# Patient Record
Sex: Male | Born: 1975
Health system: Southern US, Community
[De-identification: ages and names within clinical notes are randomized; demographics above are authoritative.]

## PROBLEM LIST (undated history)

## (undated) DIAGNOSIS — I4891 Unspecified atrial fibrillation: Secondary | ICD-10-CM

## (undated) HISTORY — DX: Unspecified atrial fibrillation: I48.91

## (undated) HISTORY — PX: CHOLECYSTECTOMY: SHX55

---

## 2005-12-07 ENCOUNTER — Ambulatory Visit (HOSPITAL_COMMUNITY): Admission: RE | Admit: 2005-12-07 | Discharge: 2005-12-07 | Payer: Self-pay | Admitting: Gastroenterology

## 2006-03-14 ENCOUNTER — Encounter (INDEPENDENT_AMBULATORY_CARE_PROVIDER_SITE_OTHER): Payer: Self-pay | Admitting: *Deleted

## 2006-03-14 ENCOUNTER — Ambulatory Visit (HOSPITAL_COMMUNITY): Admission: RE | Admit: 2006-03-14 | Discharge: 2006-03-14 | Payer: Self-pay | Admitting: General Surgery

## 2010-05-13 ENCOUNTER — Ambulatory Visit: Payer: Self-pay | Admitting: Cardiology

## 2010-05-13 ENCOUNTER — Encounter: Payer: Self-pay | Admitting: Cardiology

## 2010-05-13 ENCOUNTER — Observation Stay (HOSPITAL_COMMUNITY): Admission: EM | Admit: 2010-05-13 | Discharge: 2010-05-13 | Payer: Self-pay | Admitting: Emergency Medicine

## 2010-05-23 DIAGNOSIS — I4891 Unspecified atrial fibrillation: Secondary | ICD-10-CM | POA: Insufficient documentation

## 2010-05-25 ENCOUNTER — Ambulatory Visit: Payer: Self-pay | Admitting: Cardiology

## 2010-05-25 DIAGNOSIS — F17200 Nicotine dependence, unspecified, uncomplicated: Secondary | ICD-10-CM | POA: Insufficient documentation

## 2010-05-26 LAB — CONVERTED CEMR LAB
BUN: 10 mg/dL (ref 6–23)
Calcium: 9.1 mg/dL (ref 8.4–10.5)
Chloride: 107 meq/L (ref 96–112)
GFR calc non Af Amer: 95.46 mL/min (ref >60)
Glucose, Bld: 108 mg/dL — ABNORMAL HIGH (ref 70–99)

## 2010-07-29 ENCOUNTER — Encounter (INDEPENDENT_AMBULATORY_CARE_PROVIDER_SITE_OTHER): Payer: Self-pay | Admitting: *Deleted

## 2010-10-11 ENCOUNTER — Encounter (INDEPENDENT_AMBULATORY_CARE_PROVIDER_SITE_OTHER): Payer: Self-pay | Admitting: *Deleted

## 2010-12-13 NOTE — Letter (Signed)
Summary: Appointment - Missed  [facility], Main Office  [address]   [place], [place] [address]   Phone: [phone]  Fax: [phone]     July 29, 2010 MRN: [id number]   Gary Sandoval [address] [place], [place]  [address]   Dear Mr. Damron, Our records indicate you missed your appointment on  07/04/10 with Dr. Myrtis Ser .It is very important that we reach you to reschedule this appointment. We look forward to participating in your health care needs. Please contact us at the number listed above at your earliest convenience to reschedule this appointment.     Sincerely,  Artist

## 2010-12-13 NOTE — Letter (Signed)
Summary: Appointment - Missed  [facility], Main Office  [address]   [place], [place] [address]   Phone: [phone]  Fax: [phone]     October 11, 2010 MRN: [id number]   Gary Sandoval [address] [place], [place]  [address]   Dear [name],  Our records indicate you missed your appointment on 07/15/10 with Dr. Myrtis Ser . It is very important that we reach you to reschedule this appointment. We look forward to participating in your health care needs. Please contact us at the number listed above at your earliest convenience to reschedule this appointment.     Sincerely,  Judie Grieve  [employer] Scheduling Team

## 2010-12-13 NOTE — Assessment & Plan Note (Signed)
Summary: eph/afib/jml   Visit Type:  post hospital visit Primary Provider:  none  CC:  atrial fibrillation.  History of Present Illness: The patient was seen and admitted to [facility] on May 13, 2010.  He presented with rapid atrial fibrillation with a rate of 160s.  He felt very poorly with this.  He was admitted and two-dimensional echo was done showing good LV function.  He converted later that day and we allowed him to go home on diltiazem.  TSH was normal.  It was clear that he had a very significant caffeine intake.  He also smoked 2-3 packs per day.  Since being at home he has done well.  His potassium was noted to be 3.3 in the hospital.  He received some potassium in the hospital has taken a small supplement at home.  He feels well now.  Current Medications (verified): 1)  Diltiazem Hcl Cr 90 Mg Xr12h-Cap (Diltiazem Hcl) .... Take One Capsule By Mouth Twice A Day 2)  Potassium 99 Mg Tabs (Potassium) .... Two Times A Day  Allergies (verified): No Known Drug Allergies  Past History:  Past Medical History: Atrial Fibrillation....paroxysmal.... May 13, 2010 Hypokalemia Excess caffeine intake Tobacco abuse EF 65%.... echo.... 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  Alcohol Use - no Regular Exercise - no Drug Use - no  Review of Systems       Patient denies fever, chills, headache, sweats, rash, change in vision, change in hearing, chest pain, cough, nausea vomiting, urinary symptoms.  All of the systems are reviewed and are negative.  Vital Signs:  Patient profile:   35 year old male Height:      80 inches Weight:      323 pounds BMI:     35.61 Pulse rate:   78 / minute BP sitting:   126 / 74  (left arm) Cuff size:   large  Vitals Entered By:  Hardin Negus, RMA (May 25, 2010 12:23 PM)  Physical Exam  General:  patient is stable. Eyes:  no xanthelasma. Neck:  no jugular venous distention. Lungs:  lungs are clear.  Respiratory effort is nonlabored. Heart:  cardiac exam reveals S1 and S2.  There no clicks or significant murmurs. Abdomen:  abdomen is soft and Extremities:  no peripheral edema. Psych:  patient is oriented to person time and place.  Affect is normal.   Impression & Recommendations:  Problem # 1:  * CAFFEINE EXCESS since being in the hospital the patient says that he has stopped caffeine completely.  He is tolerating this.  Problem # 2:  * HYPOKALEMIA In the hospital his potassium was reduced.  He is taking a small over-the-counter supplement.  Chemistry will be checked today.  Problem # 3:  TOBACCO ABUSE (ICD-305.1) The patient has cut his smoking to approximately 1/2 pack per day.  He seems quite motivated.  I am hopeful he'll stop completely.  Problem # 4:  ATRIAL FIBRILLATION (ICD-427.31)  Orders: EKG w/ Interpretation (93000) TLB-BMP (Basic Metabolic Panel-BMET) (80048-METABOL) EKG is done today and reviewed by me.  There is normal sinus rhythm.  The patient has now had one episode of atrial fibrillation.  He has normal LV function.  There no significant valvular abnormalities.  I'm hopeful with the dramatic decrease  in caffeine and smoking that he may not have any recurrent problems.  I'll see him for followup and we'll decide about whether he needs to remain on diltiazem.  Patient Instructions: 1)  Lab today 2)  Follow up in 8 weeks Prescriptions: DILTIAZEM HCL 90 MG TABS (DILTIAZEM HCL) Take 1 tablet by mouth two times a day  #60 x 6   Entered by:   Meredith Staggers, RN   Authorized by:   Talitha Givens, MD, [facility]   Signed by:   Meredith Staggers, RN on 05/25/2010   Method used:   Electronically to        [facility] [id number] (retail)       [address]       [place], [place]  [address]        Ph: [phone]       Fax: [phone]   RxID:   [id number]

## 2011-01-29 LAB — HEMOGLOBIN A1C: Hgb A1c MFr Bld: 5.4 % (ref <5.7)

## 2011-01-29 LAB — DIFFERENTIAL
Lymphocytes Relative: 39 % (ref 12–46)
Lymphs Abs: 3.2 10*3/uL (ref 0.7–4.0)
Monocytes Absolute: 0.5 10*3/uL (ref 0.1–1.0)
Monocytes Relative: 7 % (ref 3–12)
Neutrophils Relative %: 51 % (ref 43–77)

## 2011-01-29 LAB — COMPREHENSIVE METABOLIC PANEL
AST: 17 U/L (ref 0–37)
Albumin: 3.5 g/dL (ref 3.5–5.2)
BUN: 8 mg/dL (ref 6–23)
Calcium: 8.8 mg/dL (ref 8.4–10.5)
Chloride: 107 mEq/L (ref 96–112)
Creatinine, Ser: 0.99 mg/dL (ref 0.4–1.5)
GFR calc Af Amer: >60 mL/min (ref >60)
GFR calc non Af Amer: >60 mL/min (ref >60)
Potassium: 4.4 mEq/L (ref 3.5–5.1)
Sodium: 141 mEq/L (ref 135–145)
Total Bilirubin: 0.7 mg/dL (ref 0.3–1.2)

## 2011-01-29 LAB — CBC
HCT: 45.8 % (ref 39.0–52.0)
Hemoglobin: 15.7 g/dL (ref 13.0–17.0)
MCH: 32.7 pg (ref 26.0–34.0)
MCHC: 34.3 g/dL (ref 30.0–36.0)
RDW: 13.3 % (ref 11.5–15.5)
WBC: 8.2 10*3/uL (ref 4.0–10.5)

## 2011-01-29 LAB — BRAIN NATRIURETIC PEPTIDE: Pro B Natriuretic peptide (BNP): 96 pg/mL (ref 0.0–100.0)

## 2011-01-29 LAB — CK TOTAL AND CKMB (NOT AT ARMC)
CK, MB: 1.5 ng/mL (ref 0.3–4.0)
Relative Index: 1.1 (ref 0.0–2.5)

## 2011-01-29 LAB — POCT CARDIAC MARKERS
Myoglobin, poc: 87.9 ng/mL (ref 12–200)
Troponin i, poc: <0.05 ng/mL (ref 0.00–0.09)

## 2011-01-29 LAB — MAGNESIUM: Magnesium: 2.2 mg/dL (ref 1.5–2.5)

## 2011-01-29 LAB — POCT I-STAT, CHEM 8
BUN: 9 mg/dL (ref 6–23)
Creatinine, Ser: 1 mg/dL (ref 0.4–1.5)
Sodium: 142 mEq/L (ref 135–145)
TCO2: 20 mmol/L (ref 0–100)

## 2011-01-29 LAB — APTT: aPTT: 30 seconds (ref 24–37)

## 2011-03-30 ENCOUNTER — Emergency Department (HOSPITAL_COMMUNITY)
Admission: EM | Admit: 2011-03-30 | Discharge: 2011-03-30 | Disposition: A | Discharge status: Home / Self Care | Payer: Self-pay | Attending: Emergency Medicine | Admitting: Emergency Medicine

## 2011-03-30 DIAGNOSIS — J3489 Other specified disorders of nose and nasal sinuses: Secondary | ICD-10-CM | POA: Insufficient documentation

## 2011-03-30 DIAGNOSIS — J329 Chronic sinusitis, unspecified: Secondary | ICD-10-CM | POA: Insufficient documentation

## 2011-03-30 DIAGNOSIS — I4891 Unspecified atrial fibrillation: Secondary | ICD-10-CM | POA: Insufficient documentation

## 2011-03-31 NOTE — Op Note (Signed)
NAME:  Gary Sandoval, Gary Sandoval NO.:  [id number]   MEDICAL RECORD NO.:  [id number]          PATIENT TYPE:  AMB   LOCATION:  DAY                          FACILITY:  [facility]   PHYSICIAN:  Gita Kudo, M.D. DATE OF BIRTH:  08/18/1976   DATE OF PROCEDURE:  03/14/2006  DATE OF DISCHARGE:                                 OPERATIVE REPORT   OPERATIVE PROCEDURE:  Laparoscopic cholecystectomy with intraoperative  cholangiogram.   SURGEON:  Gita Kudo, M.D.   ASSISTANT:  Maisie Fus A. Cornett, M.D.   ANESTHESIA:  General endotracheal.   PREOPERATIVE DIAGNOSIS:  Cholecystitis, cholelithiasis.   PREOPERATIVE DIAGNOSIS:  Cholecystitis, cholelithiasis, with normal  cholangiogram.   CLINICAL SUMMARY:  36 year old man with abdominal pain.  Workup by Dr. Loreta Ave  and her group revealed gallstones.  He is brought in for elective  cholecystectomy.  He had slightly elevated liver function studies one week  ago.  His preop studies today showed a normal white count and totally normal  liver function as well as amylase and lipase.   OPERATIVE FINDINGS:  The gallbladder was thin-walled without evidence of  acute inflammation or infection.  The cystic duct was normal in anatomy as  was the cystic artery and the cholangiogram looked good.   OPERATIVE PROCEDURE:  Under satisfactory general endotracheal anesthesia,  having received 1 grams Ancef preop, the patient's abdomen was prepped and  draped in a standard fashion.  A total of 30 mL of 0.5% Marcaine with  epinephrine was infiltrated for postoperative analgesia.  A transverse incision was made at the umbilicus and the midline opened into  the peritoneum.  This was controlled with a figure-of-eight 0 Vicryl suture  and a Hasson port inserted and secured.  Good CO2 pneumoperitoneum was  established and then the two #5 ports placed laterally and a second #10 port  medially with good visualization through the camera.  Lateral port  graspers  gave excellent exposure and operating through the medial port, we carefully  dissected the cystic duct and artery.  Once securely sure of the anatomy,  the artery had multiple clips placed and divided between the distal ones.  A  single clip was placed on the duct near the gallbladder, incision made and  percutaneous catheter placed, and good cholangiogram obtained.  The catheter  was withdrawn and the duct controlled with multiple clips and divided.  Following this, the gallbladder was removed from below upward using cautery  for hemostasis and dissection.  A small hole was made in the gallbladder  with spillage of clear bile but no stones.  The gallbladder was placed in an  EndoCatch bag.  The operative site was lavaged with saline and then using a  total of 2 liters of saline, the abdomen was thoroughly irrigated with clear  return.  The camera was then moved to the upper port and a large grasper  used to extract the gallbladder in the bag through the umbilicus without  spillage or problem.  The operative site was again checked, lavaged, and  suctioned and CO2 and ports released.  The midline  was closed with the previous figure-of-eight and a second interrupted 0  Vicryl figure-of-eight.  Then, the subcu was approximated with 4-0 Vicryl  and Steri-Strips for the skin.  Sterile absorbent dressings were applied,  the patient went to the recovery room in good condition.  Sponge and needle  counts were correct.  There were no complication.           ______________________________  Gita Kudo, M.D.     MRL/MEDQ  D:  03/14/2006  T:  03/14/2006  Job:  [id number]   cc:   Lianne Bushy, M.D.  Fax: [phone]   JXBJYN WGN FAOZ, M.D.  Fax: [phone]

## 2015-06-13 ENCOUNTER — Encounter (HOSPITAL_COMMUNITY): Payer: Self-pay | Admitting: Emergency Medicine

## 2015-06-13 ENCOUNTER — Emergency Department (HOSPITAL_COMMUNITY)
Admission: EM | Admit: 2015-06-13 | Discharge: 2015-06-13 | Disposition: A | Discharge status: Home / Self Care | Payer: 59 | Attending: Emergency Medicine | Admitting: Emergency Medicine

## 2015-06-13 DIAGNOSIS — Y9289 Other specified places as the place of occurrence of the external cause: Secondary | ICD-10-CM | POA: Diagnosis not present

## 2015-06-13 DIAGNOSIS — S61219A Laceration without foreign body of unspecified finger without damage to nail, initial encounter: Secondary | ICD-10-CM

## 2015-06-13 DIAGNOSIS — I4891 Unspecified atrial fibrillation: Secondary | ICD-10-CM | POA: Insufficient documentation

## 2015-06-13 DIAGNOSIS — Y9389 Activity, other specified: Secondary | ICD-10-CM | POA: Diagnosis not present

## 2015-06-13 DIAGNOSIS — Y288XXA Contact with other sharp object, undetermined intent, initial encounter: Secondary | ICD-10-CM | POA: Insufficient documentation

## 2015-06-13 DIAGNOSIS — Z720 Tobacco use: Secondary | ICD-10-CM | POA: Insufficient documentation

## 2015-06-13 DIAGNOSIS — S61213A Laceration without foreign body of left middle finger without damage to nail, initial encounter: Secondary | ICD-10-CM | POA: Diagnosis present

## 2015-06-13 DIAGNOSIS — S61211A Laceration without foreign body of left index finger without damage to nail, initial encounter: Secondary | ICD-10-CM | POA: Diagnosis not present

## 2015-06-13 DIAGNOSIS — Y998 Other external cause status: Secondary | ICD-10-CM | POA: Diagnosis not present

## 2015-06-13 HISTORY — DX: Unspecified atrial fibrillation: I48.91

## 2015-06-13 MED ORDER — BUPIVACAINE HCL (PF) 0.5 % IJ SOLN
10.0000 mL | Freq: Once | INTRAMUSCULAR | Status: AC
  Administered 2015-06-13: 10 mL
  Filled 2015-06-13: qty 10

## 2015-06-13 MED ORDER — TETANUS-DIPHTH-ACELL PERTUSSIS 5-2.5-18.5 LF-MCG/0.5 IM SUSP
0.5000 mL | Freq: Once | INTRAMUSCULAR | Status: AC
  Administered 2015-06-13: 0.5 mL via INTRAMUSCULAR
  Filled 2015-06-13: qty 0.5

## 2015-06-13 NOTE — ED Notes (Signed)
Pt cut his left pointer finger on a transfer housing casing. Pt reports numbness to finger. Bleeding controlled at present.

## 2015-06-13 NOTE — ED Provider Notes (Signed)
CSN: [id number]     Arrival date & time 06/13/15  0745 History   First MD Initiated Contact with Patient 06/13/15 [phone]     Chief Complaint  Patient presents with  . Extremity Laceration     (Consider location/radiation/quality/duration/timing/severity/associated sxs/prior Treatment) Patient is a 39 y.o. male presenting with skin laceration. The history is provided by the patient.  Laceration Location:  Hand Hand laceration location:  L finger Length (cm):  2 Depth:  Cutaneous Bleeding: controlled   Time since incident:  2 hours Laceration mechanism:  Metal edge Pain details:    Quality:  Aching   Severity:  Moderate   Timing:  Constant   Progression:  Unchanged Foreign body present:  No foreign bodies Worsened by:  Movement Ineffective treatments:  None tried Tetanus status:  Unknown   Past Medical History  Diagnosis Date  . A-fib    Past Surgical History  Procedure Laterality Date  . Cholecystectomy     No family history on file. History  Substance Use Topics  . Smoking status: Current Every Day Smoker  . Smokeless tobacco: Not on file  . Alcohol Use: No    Review of Systems  Constitutional: Negative for fever and chills.  Respiratory: Negative for cough and shortness of breath.   Cardiovascular: Negative for chest pain and palpitations.  Gastrointestinal: Negative for vomiting, abdominal pain, diarrhea and constipation.  Genitourinary: Negative for dysuria, urgency and frequency.  Musculoskeletal: Negative for myalgias and arthralgias.  Skin: Positive for wound. Negative for rash.  Neurological: Negative for headaches.  All other systems reviewed and are negative.     Allergies  Review of patient's allergies indicates no known allergies.  Home Medications   Prior to Admission medications   Not on File   BP 125/84 mmHg  Pulse 86  Resp 18  Ht 6\' 8"  (2.032 m)  Wt 335 lb (151.955 kg)  BMI 36.80 kg/m2  SpO2 98% Physical Exam  Constitutional: He  appears well-developed and well-nourished. No distress.  HENT:  Head: Normocephalic and atraumatic.  Eyes: Conjunctivae are normal. No scleral icterus.  Neck: Normal range of motion. Neck supple.  Cardiovascular: Normal rate, regular rhythm and normal heart sounds.   Pulmonary/Chest: Effort normal and breath sounds normal. No respiratory distress.  Abdominal: Soft. There is no tenderness.  Musculoskeletal: He exhibits no edema.       Hands: Neurological: He is alert.  Skin: Skin is warm and dry. He is not diaphoretic.  Psychiatric: His behavior is normal.  Nursing note and vitals reviewed.   ED Course  LACERATION REPAIR Date/Time: 06/13/2015 9:13 AM Performed by: Arthor Captain Authorized by: Arthor Captain Consent: Verbal consent obtained. Risks and benefits: risks, benefits and alternatives were discussed Consent given by: patient Patient identity confirmed: provided demographic data Time out: Immediately prior to procedure a "time out" was called to verify the correct patient, procedure, equipment, support staff and site/side marked as required. Body area: upper extremity Location details: left index finger Laceration length: 2 cm Contamination: The wound is contaminated. (contaminated with Grease and dirt from working on car) Foreign bodies: no foreign bodies Tendon involvement: none Nerve involvement: none Vascular damage: no Anesthesia: digital block Local anesthetic: bupivacaine 0.5% without epinephrine Anesthetic total: 9 ml Patient sedated: no Preparation: Patient was prepped and draped in the usual sterile fashion. Irrigation solution: saline Amount of cleaning: extensive Debridement: minimal Degree of undermining: none Skin closure: Ethilon Number of sutures: 4 Technique: simple Approximation: loose Approximation difficulty: simple Patient tolerance:  Patient tolerated the procedure well with no immediate complications   (including critical care  time) Labs Review Labs Reviewed - No data to display  Imaging Review No results found.   EKG Interpretation None      MDM   Final diagnoses:  None    9:13 AM. BP 125/84 mmHg  Pulse 86  Resp 18  Ht  (2.032 m)  Wt 335 lb (151.955 kg)  BMI 36.80 kg/m2  SpO2 98% Tdap booster given.Pressure irrigation performed. Laceration occurred < 8 hours prior to repair which was well tolerated. Pt has no co morbidities to effect normal wound healing. Discussed suture home care w pt and answered questions. Pt to f-u for wound check and suture removal in 7 days. Pt is hemodynamically stable w no complaints prior to dc.       Arthor Captain, PA-C 06/13/15 1610  Pricilla Loveless, MD 06/14/15 [phone]

## 2015-06-13 NOTE — Discharge Instructions (Signed)

## 2015-06-13 NOTE — ED Provider Notes (Signed)
CSN: [id number]     Arrival date & time 06/13/15  0745 History   First MD Initiated Contact with Patient 06/13/15 [phone]     Chief Complaint  Patient presents with  . Extremity Laceration     (Consider location/radiation/quality/duration/timing/severity/associated sxs/prior Treatment) HPI Comments: Heywood Bene, 39 y/o male presents with a laceration of his third digit on his left hand. 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 He says that the bleeding was pulsatile, but is not under control. He has decreased sensation to his fingertip, but it still able to feel. He does not know if he is up to date on tetanus.   The history is provided by the patient.    Past Medical History  Diagnosis Date  . A-fib    Past Surgical History  Procedure Laterality Date  . Cholecystectomy     No family history on file. History  Substance Use Topics  . Smoking status: Current Every Day Smoker  . Smokeless tobacco: Not on file  . Alcohol Use: No    Review of Systems  Constitutional: Negative for fever and chills.  Skin: Positive for wound.  Neurological: Negative for weakness and numbness.  Hematological: Does not bruise/bleed easily.      Allergies  Review of patient's allergies indicates no known allergies.  Home Medications   Prior to Admission medications   Not on File   BP 135/85 mmHg  Pulse 86  Resp 18  Ht  (2.032 m)  Wt 335 lb (151.955 kg)  BMI 36.80 kg/m2  SpO2 100% Physical Exam  Constitutional: He appears well-developed and well-nourished. No distress.  HENT:  Head: Normocephalic and atraumatic.  Eyes: Conjunctivae are normal. No scleral icterus.  Neck: Normal range of motion. Neck supple.  Cardiovascular: Normal rate, regular rhythm and normal heart sounds.   Pulmonary/Chest: Effort normal and breath sounds normal. No respiratory distress.  Abdominal: Soft. There is no tenderness.  Musculoskeletal: He exhibits no edema.   Neurological: He is alert.  Skin: Skin is warm and dry. He is not diaphoretic.  2 cm lac of the left index finger.  Psychiatric: His behavior is normal.  Nursing note and vitals reviewed.   ED Course  Procedures (including critical care time) Labs Review Labs Reviewed - No data to display  Imaging Review No results found.   EKG Interpretation None      MDM   Final diagnoses:  Finger laceration, initial encounter    Tdap booster given.Pressure irrigation performed. Laceration occurred < 8 hours prior to repair which was well tolerated. Pt has no co morbidities to effect normal wound healing. Discussed suture home care w pt and answered questions. Pt to f-u for wound check and suture removal in 7 days. Pt is hemodynamically stable w no complaints prior to dc.       Arthor Captain, PA-C 06/14/15 1840  Pricilla Loveless, MD 06/15/15 [phone]

## 2015-10-18 ENCOUNTER — Other Ambulatory Visit: Payer: Self-pay | Admitting: Physician Assistant

## 2015-10-18 DIAGNOSIS — IMO0002 Reserved for concepts with insufficient information to code with codable children: Secondary | ICD-10-CM

## 2015-10-18 DIAGNOSIS — R229 Localized swelling, mass and lump, unspecified: Principal | ICD-10-CM

## 2015-10-25 ENCOUNTER — Ambulatory Visit
Admission: RE | Admit: 2015-10-25 | Discharge: 2015-10-25 | Disposition: A | Discharge status: Home / Self Care | Payer: 59 | Source: Ambulatory Visit | Attending: Physician Assistant | Admitting: Physician Assistant

## 2015-10-25 ENCOUNTER — Ambulatory Visit
Admission: RE | Admit: 2015-10-25 | Discharge: 2015-10-25 | Disposition: A | Discharge status: Home / Self Care | Payer: No Typology Code available for payment source | Source: Ambulatory Visit | Attending: Physician Assistant | Admitting: Physician Assistant

## 2015-10-25 DIAGNOSIS — R229 Localized swelling, mass and lump, unspecified: Principal | ICD-10-CM

## 2015-10-25 DIAGNOSIS — IMO0002 Reserved for concepts with insufficient information to code with codable children: Secondary | ICD-10-CM

## 2015-11-09 ENCOUNTER — Other Ambulatory Visit: Payer: Self-pay

## 2016-05-10 DIAGNOSIS — R21 Rash and other nonspecific skin eruption: Secondary | ICD-10-CM | POA: Diagnosis not present

## 2016-05-10 DIAGNOSIS — Z6841 Body Mass Index (BMI) 40.0 and over, adult: Secondary | ICD-10-CM | POA: Diagnosis not present

## 2017-01-12 DIAGNOSIS — H40013 Open angle with borderline findings, low risk, bilateral: Secondary | ICD-10-CM | POA: Diagnosis not present

## 2017-01-17 DIAGNOSIS — R1110 Vomiting, unspecified: Secondary | ICD-10-CM | POA: Diagnosis not present

## 2018-03-08 DIAGNOSIS — I8001 Phlebitis and thrombophlebitis of superficial vessels of right lower extremity: Secondary | ICD-10-CM | POA: Diagnosis not present

## 2018-03-11 DIAGNOSIS — I83813 Varicose veins of bilateral lower extremities with pain: Secondary | ICD-10-CM | POA: Diagnosis not present

## 2018-03-11 DIAGNOSIS — Z6839 Body mass index (BMI) 39.0-39.9, adult: Secondary | ICD-10-CM | POA: Diagnosis not present

## 2018-03-11 DIAGNOSIS — Z1331 Encounter for screening for depression: Secondary | ICD-10-CM | POA: Diagnosis not present

## 2018-03-11 DIAGNOSIS — I80292 Phlebitis and thrombophlebitis of other deep vessels of left lower extremity: Secondary | ICD-10-CM | POA: Diagnosis not present

## 2018-03-14 ENCOUNTER — Other Ambulatory Visit: Payer: Self-pay

## 2018-03-14 DIAGNOSIS — I83893 Varicose veins of bilateral lower extremities with other complications: Secondary | ICD-10-CM

## 2018-04-11 ENCOUNTER — Encounter: Payer: Self-pay | Admitting: Vascular Surgery

## 2018-04-11 ENCOUNTER — Ambulatory Visit (HOSPITAL_COMMUNITY)
Admission: RE | Admit: 2018-04-11 | Discharge: 2018-04-11 | Disposition: A | Discharge status: Home / Self Care | Payer: BLUE CROSS/BLUE SHIELD | Source: Ambulatory Visit | Attending: Vascular Surgery | Admitting: Vascular Surgery

## 2018-04-11 ENCOUNTER — Ambulatory Visit: Payer: BLUE CROSS/BLUE SHIELD | Admitting: Vascular Surgery

## 2018-04-11 ENCOUNTER — Other Ambulatory Visit: Payer: Self-pay

## 2018-04-11 VITALS — BP 134/89 | HR 73 | Resp 20 | Ht >= 80 in | Wt 331.0 lb

## 2018-04-11 DIAGNOSIS — I872 Venous insufficiency (chronic) (peripheral): Secondary | ICD-10-CM | POA: Insufficient documentation

## 2018-04-11 DIAGNOSIS — I83813 Varicose veins of bilateral lower extremities with pain: Secondary | ICD-10-CM

## 2018-04-11 DIAGNOSIS — I83893 Varicose veins of bilateral lower extremities with other complications: Secondary | ICD-10-CM | POA: Diagnosis not present

## 2018-04-11 NOTE — Progress Notes (Signed)
Referring Physician: Lonie Peak, PA  Patient name: Gary Sandoval MRN: [id number] DOB: 07-08-1976 Sex: male  REASON FOR CONSULT: Bilateral varicose veins with pain  HPI: Gary Sandoval is a 42 y.o. male with a long history of bilateral lower extremity varicose veins.  These have become more painful over the last few months.  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  He has achiness and fullness that occurs by the end of the day.  He does not currently have any active ulcerations.  He has a family history of varicose veins in his mother.  He denies prior history of DVT.  However, his brother has had an IVC filter placed.  He also had another brother with a DVT.  He has intermittently worn some compression stockings in the past with some mild relief of symptoms.  States that his left leg is more of a problem to him than the right as he has a large cluster of varicosities in the left leg that is very aggravated with compression stockings and at work.  Other medical problems include most history of atrial fibrillation which the patient states has now resolved.  Past Medical History:  Diagnosis Date  . A-fib (HCC)   . Atrial fibrillation [facility])    Past Surgical History:  Procedure Laterality Date  . CHOLECYSTECTOMY      History reviewed. No pertinent family history.  SOCIAL HISTORY: Social History   Socioeconomic History  . Marital status: Single    Spouse name: Not on file  . Number of children: Not on file  . Years of education: Not on file  . Highest education level: Not on file  Occupational History  . Not on file  Social Needs  . Financial resource strain: Not on file  . Food insecurity:    Worry: Not on file    Inability: Not on file  . Transportation needs:    Medical: Not on file    Non-medical: Not on file  Tobacco Use  . Smoking status: Current Every Day Smoker    Packs/day: 1.00    Types: Cigarettes  . Smokeless tobacco: Never  Used  Substance and Sexual Activity  . Alcohol use: No  . Drug use: No  . Sexual activity: Not on file  Lifestyle  . Physical activity:    Days per week: Not on file    Minutes per session: Not on file  . Stress: Not on file  Relationships  . Social connections:    Talks on phone: Not on file    Gets together: Not on file    Attends religious service: Not on file    Active member of club or organization: Not on file    Attends meetings of clubs or organizations: Not on file    Relationship status: Not on file  . Intimate partner violence:    Fear of current or ex partner: Not on file    Emotionally abused: Not on file    Physically abused: Not on file    Forced sexual activity: Not on file  Other Topics Concern  . Not on file  Social History Narrative  . Not on file    No Known Allergies  Current Outpatient Medications  Medication Sig Dispense Refill  . Echinacea 450 MG CAPS Take by mouth.     No current facility-administered medications for this visit.     ROS:   General:  No weight loss,  Fever, chills  HEENT: No recent headaches, no nasal bleeding, no visual changes, no sore throat  Neurologic: No dizziness, blackouts, seizures. No recent symptoms of stroke or mini- stroke. No recent episodes of slurred speech, or temporary blindness.  Cardiac: No recent episodes of chest pain/pressure, no shortness of breath at rest.  No shortness of breath with exertion.  Denies history of atrial fibrillation or irregular heartbeat  Vascular: No history of rest pain in feet.  No history of claudication.  No history of non-healing ulcer, No history of DVT   Pulmonary: No home oxygen, no productive cough, no hemoptysis,  No asthma or wheezing  Musculoskeletal:   Arthritis,  Low back pain,   Joint pain  Hematologic:No history of hypercoagulable state.  No history of easy bleeding.  No history of anemia  Gastrointestinal: No hematochezia or melena,  No gastroesophageal  reflux, no trouble swallowing  Urinary:  chronic Kidney disease,  on HD -  MWF or  TTHS,  Burning with urination,  Frequent urination,  Difficulty urinating;   Skin: No rashes  Psychological: No history of anxiety,  No history of depression   Physical Examination  Vitals:   04/11/18 0849  BP: 134/89  Pulse: 73  Resp: 20  SpO2: 97%  Weight: (!) 331 lb (150.1 kg)  Height:  (2.032 m)    Body mass index is 36.36 kg/m.  General:  Alert and oriented, no acute distress HEENT: Normal Neck: No bruit or JVD Pulmonary: Clear to auscultation bilaterally Cardiac: Regular Rate and Rhythm without murmur Abdomen: Soft, non-tender, non-distended, no mass, no scars Skin: No rash, large clusters of varicosities scattered over the greater saphenous vein in the popliteal fossa bilaterally.          Extremity Pulses:  2+ radial, brachial, femoral, dorsalis pedis, posterior tibial pulses bilaterally Musculoskeletal: No deformity or edema  Neurologic: Upper and lower extremity motor 5/5 and symmetric  DATA:  Patient had bilateral lower extremity reflux exam today.  This showed reflux diffusely throughout the greater saphenous vein at saphenofemoral junction bilaterally.  Vein diameter on the right was 5 to 6 mm.  On the left it was 5 to 8 mm.  He also had reflux in the left lesser saphenous vein.  He also had reflux in the common femoral vein bilaterally.  ASSESSMENT: Bilateral lower extremity varicose veins with pain left greater than right.   PLAN: Patient will try 3 months of diligent compression therapy and was given a compression therapy prescription today for thigh-high 20 to 30 mmHg compression stockings.  He will return in 3 months for further consideration and possible consideration of laser ablation if his symptoms do not improve   Fabienne Bruns, MD [facility] Office: [phone] Pager: [phone]

## 2018-04-19 ENCOUNTER — Ambulatory Visit (HOSPITAL_COMMUNITY)
Admission: RE | Admit: 2018-04-19 | Discharge: 2018-04-19 | Disposition: A | Discharge status: Home / Self Care | Payer: BLUE CROSS/BLUE SHIELD | Source: Ambulatory Visit | Attending: Vascular Surgery | Admitting: Vascular Surgery

## 2018-04-19 ENCOUNTER — Telehealth: Payer: Self-pay | Admitting: Vascular Surgery

## 2018-04-19 ENCOUNTER — Other Ambulatory Visit: Payer: Self-pay

## 2018-04-19 DIAGNOSIS — R609 Edema, unspecified: Secondary | ICD-10-CM | POA: Insufficient documentation

## 2018-04-19 DIAGNOSIS — M79605 Pain in left leg: Secondary | ICD-10-CM

## 2018-04-19 DIAGNOSIS — M7989 Other specified soft tissue disorders: Secondary | ICD-10-CM | POA: Insufficient documentation

## 2018-04-19 NOTE — Telephone Encounter (Signed)
Pt called complaining of needle like pain and warmth over a cluster of varicosities.  Advised him to place warm compresses on the vein and elevate the leg and take ibuprofen.    I will arrange a duplex at our office later today to rule out DVT.  Fabienne Brunsharles Fields, MD [facility] [place] Office: [phone] Pager: [phone]

## 2018-07-11 ENCOUNTER — Ambulatory Visit: Payer: BLUE CROSS/BLUE SHIELD | Admitting: Vascular Surgery

## 2018-07-17 ENCOUNTER — Ambulatory Visit: Payer: BLUE CROSS/BLUE SHIELD | Admitting: Vascular Surgery

## 2018-07-17 ENCOUNTER — Encounter: Payer: Self-pay | Admitting: Vascular Surgery

## 2021-06-10 ENCOUNTER — Inpatient Hospital Stay (HOSPITAL_COMMUNITY): Payer: 59

## 2021-06-10 ENCOUNTER — Inpatient Hospital Stay (HOSPITAL_COMMUNITY)
Admission: EM | Admit: 2021-06-10 | Discharge: 2021-06-12 | DRG: 246 | Disposition: A | Discharge status: Home / Self Care | Payer: 59 | Attending: Internal Medicine | Admitting: Internal Medicine

## 2021-06-10 ENCOUNTER — Encounter (HOSPITAL_COMMUNITY): Payer: Self-pay

## 2021-06-10 ENCOUNTER — Emergency Department (HOSPITAL_COMMUNITY): Payer: 59

## 2021-06-10 ENCOUNTER — Other Ambulatory Visit: Payer: Self-pay

## 2021-06-10 ENCOUNTER — Encounter (HOSPITAL_COMMUNITY)
Admission: EM | Disposition: A | Discharge status: Home / Self Care | Payer: Self-pay | Source: Home / Self Care | Attending: Cardiovascular Disease

## 2021-06-10 DIAGNOSIS — I249 Acute ischemic heart disease, unspecified: Secondary | ICD-10-CM

## 2021-06-10 DIAGNOSIS — I4901 Ventricular fibrillation: Secondary | ICD-10-CM | POA: Diagnosis present

## 2021-06-10 DIAGNOSIS — J9601 Acute respiratory failure with hypoxia: Secondary | ICD-10-CM | POA: Diagnosis not present

## 2021-06-10 DIAGNOSIS — I2510 Atherosclerotic heart disease of native coronary artery without angina pectoris: Secondary | ICD-10-CM | POA: Diagnosis present

## 2021-06-10 DIAGNOSIS — I469 Cardiac arrest, cause unspecified: Secondary | ICD-10-CM

## 2021-06-10 DIAGNOSIS — Z885 Allergy status to narcotic agent status: Secondary | ICD-10-CM

## 2021-06-10 DIAGNOSIS — Z8249 Family history of ischemic heart disease and other diseases of the circulatory system: Secondary | ICD-10-CM

## 2021-06-10 DIAGNOSIS — R001 Bradycardia, unspecified: Secondary | ICD-10-CM | POA: Diagnosis present

## 2021-06-10 DIAGNOSIS — I2102 ST elevation (STEMI) myocardial infarction involving left anterior descending coronary artery: Secondary | ICD-10-CM | POA: Diagnosis present

## 2021-06-10 DIAGNOSIS — F17210 Nicotine dependence, cigarettes, uncomplicated: Secondary | ICD-10-CM | POA: Diagnosis present

## 2021-06-10 DIAGNOSIS — Z955 Presence of coronary angioplasty implant and graft: Secondary | ICD-10-CM

## 2021-06-10 DIAGNOSIS — Z6838 Body mass index (BMI) 38.0-38.9, adult: Secondary | ICD-10-CM

## 2021-06-10 DIAGNOSIS — Y848 Other medical procedures as the cause of abnormal reaction of the patient, or of later complication, without mention of misadventure at the time of the procedure: Secondary | ICD-10-CM | POA: Diagnosis not present

## 2021-06-10 DIAGNOSIS — I462 Cardiac arrest due to underlying cardiac condition: Secondary | ICD-10-CM | POA: Diagnosis present

## 2021-06-10 DIAGNOSIS — R451 Restlessness and agitation: Secondary | ICD-10-CM | POA: Diagnosis present

## 2021-06-10 DIAGNOSIS — I214 Non-ST elevation (NSTEMI) myocardial infarction: Secondary | ICD-10-CM

## 2021-06-10 DIAGNOSIS — R338 Other retention of urine: Secondary | ICD-10-CM

## 2021-06-10 DIAGNOSIS — Z0189 Encounter for other specified special examinations: Secondary | ICD-10-CM

## 2021-06-10 DIAGNOSIS — E876 Hypokalemia: Secondary | ICD-10-CM | POA: Diagnosis not present

## 2021-06-10 DIAGNOSIS — Z9289 Personal history of other medical treatment: Secondary | ICD-10-CM

## 2021-06-10 DIAGNOSIS — R339 Retention of urine, unspecified: Secondary | ICD-10-CM | POA: Diagnosis not present

## 2021-06-10 DIAGNOSIS — F17200 Nicotine dependence, unspecified, uncomplicated: Secondary | ICD-10-CM | POA: Diagnosis present

## 2021-06-10 DIAGNOSIS — I4510 Unspecified right bundle-branch block: Secondary | ICD-10-CM | POA: Diagnosis present

## 2021-06-10 DIAGNOSIS — Z978 Presence of other specified devices: Secondary | ICD-10-CM

## 2021-06-10 DIAGNOSIS — I10 Essential (primary) hypertension: Secondary | ICD-10-CM | POA: Diagnosis present

## 2021-06-10 DIAGNOSIS — E785 Hyperlipidemia, unspecified: Secondary | ICD-10-CM | POA: Diagnosis present

## 2021-06-10 DIAGNOSIS — I480 Paroxysmal atrial fibrillation: Secondary | ICD-10-CM | POA: Diagnosis present

## 2021-06-10 DIAGNOSIS — J95851 Ventilator associated pneumonia: Secondary | ICD-10-CM | POA: Diagnosis not present

## 2021-06-10 DIAGNOSIS — Z781 Physical restraint status: Secondary | ICD-10-CM

## 2021-06-10 DIAGNOSIS — D696 Thrombocytopenia, unspecified: Secondary | ICD-10-CM | POA: Diagnosis not present

## 2021-06-10 DIAGNOSIS — Z20822 Contact with and (suspected) exposure to covid-19: Secondary | ICD-10-CM | POA: Diagnosis present

## 2021-06-10 HISTORY — PX: LEFT HEART CATH AND CORONARY ANGIOGRAPHY: CATH118249

## 2021-06-10 HISTORY — PX: CORONARY/GRAFT ACUTE MI REVASCULARIZATION: CATH118305

## 2021-06-10 LAB — BASIC METABOLIC PANEL
Anion gap: 7 (ref 5–15)
BUN: 10 mg/dL (ref 6–20)
CO2: 23 mmol/L (ref 22–32)
Calcium: 8.2 mg/dL — ABNORMAL LOW (ref 8.9–10.3)
Chloride: 105 mmol/L (ref 98–111)
Creatinine, Ser: 1 mg/dL (ref 0.61–1.24)
GFR, Estimated: >60 mL/min (ref >60)
Glucose, Bld: 135 mg/dL — ABNORMAL HIGH (ref 70–99)
Potassium: 3.9 mmol/L (ref 3.5–5.1)
Sodium: 135 mmol/L (ref 135–145)

## 2021-06-10 LAB — POCT I-STAT 7, (LYTES, BLD GAS, ICA,H+H)
Acid-base deficit: 1 mmol/L (ref 0.0–2.0)
Bicarbonate: 24.3 mmol/L (ref 20.0–28.0)
Calcium, Ion: 1.16 mmol/L (ref 1.15–1.40)
HCT: 42 % (ref 39.0–52.0)
Hemoglobin: 14.3 g/dL (ref 13.0–17.0)
O2 Saturation: 99 %
Patient temperature: 97.1
Potassium: 4 mmol/L (ref 3.5–5.1)
Sodium: 138 mmol/L (ref 135–145)
TCO2: 26 mmol/L (ref 22–32)
pCO2 arterial: 41.9 mmHg (ref 32.0–48.0)
pH, Arterial: 7.367 (ref 7.350–7.450)
pO2, Arterial: 167 mmHg — ABNORMAL HIGH (ref 83.0–108.0)

## 2021-06-10 LAB — MRSA NEXT GEN BY PCR, NASAL: MRSA by PCR Next Gen: NOT DETECTED

## 2021-06-10 LAB — CBC
HCT: 46.7 % (ref 39.0–52.0)
Hemoglobin: 16 g/dL (ref 13.0–17.0)
MCH: 32.2 pg (ref 26.0–34.0)
MCHC: 34.3 g/dL (ref 30.0–36.0)
MCV: 94 fL (ref 80.0–100.0)
Platelets: 226 10*3/uL (ref 150–400)
RBC: 4.97 MIL/uL (ref 4.22–5.81)
RDW: 12.2 % (ref 11.5–15.5)
WBC: 11.1 10*3/uL — ABNORMAL HIGH (ref 4.0–10.5)
nRBC: 0 % (ref 0.0–0.2)

## 2021-06-10 LAB — COMPREHENSIVE METABOLIC PANEL
ALT: 15 U/L (ref 0–44)
AST: 18 U/L (ref 15–41)
Albumin: 4 g/dL (ref 3.5–5.0)
Alkaline Phosphatase: 69 U/L (ref 38–126)
Anion gap: 11 (ref 5–15)
BUN: 10 mg/dL (ref 6–20)
CO2: 23 mmol/L (ref 22–32)
Calcium: 8.9 mg/dL (ref 8.9–10.3)
Chloride: 102 mmol/L (ref 98–111)
Creatinine, Ser: 1.21 mg/dL (ref 0.61–1.24)
GFR, Estimated: >60 mL/min (ref >60)
Glucose, Bld: 123 mg/dL — ABNORMAL HIGH (ref 70–99)
Potassium: 3.6 mmol/L (ref 3.5–5.1)
Sodium: 136 mmol/L (ref 135–145)
Total Bilirubin: 1.4 mg/dL — ABNORMAL HIGH (ref 0.3–1.2)
Total Protein: 6.9 g/dL (ref 6.5–8.1)

## 2021-06-10 LAB — RESP PANEL BY RT-PCR (FLU A&B, COVID) ARPGX2
Influenza A by PCR: NEGATIVE
Influenza B by PCR: NEGATIVE
SARS Coronavirus 2 by RT PCR: NEGATIVE

## 2021-06-10 LAB — POCT ACTIVATED CLOTTING TIME
Activated Clotting Time: 225 seconds
Activated Clotting Time: 271 seconds
Activated Clotting Time: 277 seconds
Activated Clotting Time: 839 seconds
Activated Clotting Time: 138 seconds

## 2021-06-10 LAB — POCT I-STAT, CHEM 8
BUN: 11 mg/dL (ref 6–20)
Calcium, Ion: 1.11 mmol/L — ABNORMAL LOW (ref 1.15–1.40)
Chloride: 103 mmol/L (ref 98–111)
Creatinine, Ser: 1 mg/dL (ref 0.61–1.24)
Glucose, Bld: 238 mg/dL — ABNORMAL HIGH (ref 70–99)
HCT: 45 % (ref 39.0–52.0)
Hemoglobin: 15.3 g/dL (ref 13.0–17.0)
Potassium: 3.3 mmol/L — ABNORMAL LOW (ref 3.5–5.1)
Sodium: 137 mmol/L (ref 135–145)
TCO2: 21 mmol/L — ABNORMAL LOW (ref 22–32)

## 2021-06-10 LAB — TROPONIN I (HIGH SENSITIVITY)
Troponin I (High Sensitivity): 17 ng/L (ref <18)
Troponin I (High Sensitivity): 1491 ng/L (ref <18)

## 2021-06-10 LAB — MAGNESIUM: Magnesium: 2.4 mg/dL (ref 1.7–2.4)

## 2021-06-10 SURGERY — LEFT HEART CATH AND CORONARY ANGIOGRAPHY
Anesthesia: LOCAL

## 2021-06-10 MED ORDER — PANTOPRAZOLE SODIUM 40 MG IV SOLR
40.0000 mg | INTRAVENOUS | Status: DC
  Administered 2021-06-10: 40 mg via INTRAVENOUS
  Filled 2021-06-10: qty 40

## 2021-06-10 MED ORDER — ONDANSETRON HCL 4 MG/2ML IJ SOLN: 4.0000 mg | Freq: Four times a day (QID) | INTRAMUSCULAR | Status: DC | PRN

## 2021-06-10 MED ORDER — PANTOPRAZOLE SODIUM 40 MG PO PACK
40.0000 mg | PACK | Freq: Every day | ORAL | Status: DC
  Administered 2021-06-11: 40 mg
  Filled 2021-06-10: qty 20

## 2021-06-10 MED ORDER — FENTANYL 2500MCG IN NS 250ML (10MCG/ML) PREMIX INFUSION
0.0000 ug/h | INTRAVENOUS | Status: DC
  Administered 2021-06-10: 75 ug/h via INTRAVENOUS
  Administered 2021-06-10 (×3): 50 ug/h via INTRAVENOUS

## 2021-06-10 MED ORDER — MIDAZOLAM HCL 2 MG/2ML IJ SOLN
INTRAMUSCULAR | Status: AC
  Filled 2021-06-10: qty 2

## 2021-06-10 MED ORDER — ONDANSETRON HCL 4 MG/2ML IJ SOLN
4.0000 mg | Freq: Once | INTRAMUSCULAR | Status: DC
  Filled 2021-06-10: qty 2

## 2021-06-10 MED ORDER — PROPOFOL 10 MG/ML IV BOLUS
INTRAVENOUS | Status: AC
  Filled 2021-06-10: qty 20

## 2021-06-10 MED ORDER — HEPARIN SODIUM (PORCINE) 1000 UNIT/ML IJ SOLN
INTRAMUSCULAR | Status: AC
  Filled 2021-06-10: qty 1

## 2021-06-10 MED ORDER — SODIUM CHLORIDE 0.9 % IV SOLN
4.0000 ug/kg/min | INTRAVENOUS | Status: DC
  Filled 2021-06-10: qty 50

## 2021-06-10 MED ORDER — TICAGRELOR 90 MG PO TABS
90.0000 mg | ORAL_TABLET | Freq: Two times a day (BID) | ORAL | Status: DC
  Administered 2021-06-10 – 2021-06-11 (×3): 90 mg
  Filled 2021-06-10 (×4): qty 1

## 2021-06-10 MED ORDER — SODIUM CHLORIDE 0.9 % IV SOLN
INTRAVENOUS | Status: AC | PRN
  Administered 2021-06-10 (×2): 4 ug/kg/min via INTRAVENOUS

## 2021-06-10 MED ORDER — HEPARIN (PORCINE) 25000 UT/250ML-% IV SOLN
1500.0000 [IU]/h | INTRAVENOUS | Status: DC
  Administered 2021-06-11: 1500 [IU]/h via INTRAVENOUS
  Filled 2021-06-10: qty 250

## 2021-06-10 MED ORDER — SODIUM CHLORIDE 0.9% FLUSH: 3.0000 mL | INTRAVENOUS | Status: DC | PRN

## 2021-06-10 MED ORDER — CANGRELOR TETRASODIUM 50 MG IV SOLR
INTRAVENOUS | Status: AC
  Filled 2021-06-10: qty 50

## 2021-06-10 MED ORDER — HYDRALAZINE HCL 20 MG/ML IJ SOLN: 10.0000 mg | INTRAMUSCULAR | Status: AC | PRN

## 2021-06-10 MED ORDER — SODIUM CHLORIDE 0.9 % IV SOLN
INTRAVENOUS | Status: AC | PRN
  Administered 2021-06-10: 250 mL
  Administered 2021-06-10: 50 mL/h via INTRAVENOUS

## 2021-06-10 MED ORDER — AMIODARONE HCL 150 MG/3ML IV SOLN
INTRAVENOUS | Status: AC | PRN
  Administered 2021-06-10: 300 mg via INTRAVENOUS

## 2021-06-10 MED ORDER — AMIODARONE HCL IN DEXTROSE 360-4.14 MG/200ML-% IV SOLN
60.0000 mg/h | INTRAVENOUS | Status: AC
  Administered 2021-06-10 (×2): 60 mg/h via INTRAVENOUS

## 2021-06-10 MED ORDER — HEPARIN SODIUM (PORCINE) 1000 UNIT/ML IJ SOLN
INTRAMUSCULAR | Status: DC | PRN
  Administered 2021-06-10: 2000 [IU] via INTRAVENOUS
  Administered 2021-06-10: 12000 [IU] via INTRAVENOUS
  Administered 2021-06-10: 2000 [IU] via INTRAVENOUS
  Administered 2021-06-10: 3500 [IU] via INTRAVENOUS

## 2021-06-10 MED ORDER — PROPOFOL 1000 MG/100ML IV EMUL
0.0000 ug/kg/min | INTRAVENOUS | Status: DC
  Administered 2021-06-10: 35 ug/kg/min via INTRAVENOUS
  Administered 2021-06-10: 30 ug/kg/min via INTRAVENOUS
  Administered 2021-06-11 (×2): 40 ug/kg/min via INTRAVENOUS
  Administered 2021-06-11 (×2): 50 ug/kg/min via INTRAVENOUS
  Filled 2021-06-10 (×3): qty 100

## 2021-06-10 MED ORDER — ATROPINE SULFATE 1 MG/10ML IJ SOSY
PREFILLED_SYRINGE | INTRAMUSCULAR | Status: AC
  Filled 2021-06-10: qty 10

## 2021-06-10 MED ORDER — FENTANYL 2500MCG IN NS 250ML (10MCG/ML) PREMIX INFUSION
INTRAVENOUS | Status: AC
  Filled 2021-06-10: qty 250

## 2021-06-10 MED ORDER — HEPARIN (PORCINE) IN NACL 1000-0.9 UT/500ML-% IV SOLN
INTRAVENOUS | Status: DC | PRN
  Administered 2021-06-10 (×3): 500 mL

## 2021-06-10 MED ORDER — MIDAZOLAM HCL 2 MG/2ML IJ SOLN
INTRAMUSCULAR | Status: DC | PRN
  Administered 2021-06-10 (×2): 2 mg via INTRAVENOUS

## 2021-06-10 MED ORDER — TICAGRELOR 90 MG PO TABS
180.0000 mg | ORAL_TABLET | Freq: Once | ORAL | Status: AC
  Administered 2021-06-10: 180 mg
  Filled 2021-06-10: qty 2

## 2021-06-10 MED ORDER — CHLORHEXIDINE GLUCONATE 0.12% ORAL RINSE (MEDLINE KIT)
15.0000 mL | Freq: Two times a day (BID) | OROMUCOSAL | Status: DC
  Administered 2021-06-10 – 2021-06-11 (×2): 15 mL via OROMUCOSAL

## 2021-06-10 MED ORDER — LIDOCAINE HCL (PF) 1 % IJ SOLN
INTRAMUSCULAR | Status: DC | PRN
  Administered 2021-06-10: 15 mL via SUBCUTANEOUS

## 2021-06-10 MED ORDER — NITROGLYCERIN 1 MG/10 ML FOR IR/CATH LAB
INTRA_ARTERIAL | Status: AC
  Filled 2021-06-10: qty 10

## 2021-06-10 MED ORDER — FENTANYL BOLUS VIA INFUSION
50.0000 ug | INTRAVENOUS | Status: DC | PRN
  Filled 2021-06-10: qty 100

## 2021-06-10 MED ORDER — FENTANYL 2500MCG IN NS 250ML (10MCG/ML) PREMIX INFUSION
50.0000 ug/h | INTRAVENOUS | Status: DC
  Administered 2021-06-10: 50 ug/h via INTRAVENOUS

## 2021-06-10 MED ORDER — SODIUM CHLORIDE 0.9 % IV SOLN: INTRAVENOUS | Status: AC

## 2021-06-10 MED ORDER — LACTATED RINGERS IV BOLUS: 1000.0000 mL | Freq: Once | INTRAVENOUS | Status: DC

## 2021-06-10 MED ORDER — EPINEPHRINE 1 MG/10ML IJ SOSY
PREFILLED_SYRINGE | INTRAMUSCULAR | Status: AC | PRN
  Administered 2021-06-10: 1 mg via INTRAVENOUS

## 2021-06-10 MED ORDER — POLYETHYLENE GLYCOL 3350 17 G PO PACK
17.0000 g | PACK | Freq: Every day | ORAL | Status: DC
  Administered 2021-06-10 – 2021-06-11 (×2): 17 g
  Filled 2021-06-10 (×2): qty 1

## 2021-06-10 MED ORDER — LIDOCAINE HCL (PF) 1 % IJ SOLN
INTRAMUSCULAR | Status: AC
  Filled 2021-06-10: qty 30

## 2021-06-10 MED ORDER — SODIUM CHLORIDE 0.9 % IV SOLN: 250.0000 mL | INTRAVENOUS | Status: DC | PRN

## 2021-06-10 MED ORDER — TICAGRELOR 90 MG PO TABS: 180.0000 mg | ORAL_TABLET | Freq: Once | ORAL | Status: DC

## 2021-06-10 MED ORDER — NITROGLYCERIN 1 MG/10 ML FOR IR/CATH LAB
INTRA_ARTERIAL | Status: DC | PRN
  Administered 2021-06-10 (×3): 100 ug via INTRACORONARY

## 2021-06-10 MED ORDER — FENTANYL CITRATE (PF) 100 MCG/2ML IJ SOLN
INTRAMUSCULAR | Status: AC
  Filled 2021-06-10: qty 2

## 2021-06-10 MED ORDER — PROPOFOL 1000 MG/100ML IV EMUL
5.0000 ug/kg/min | INTRAVENOUS | Status: DC
  Filled 2021-06-10: qty 100

## 2021-06-10 MED ORDER — SODIUM CHLORIDE 0.9% FLUSH
3.0000 mL | Freq: Two times a day (BID) | INTRAVENOUS | Status: DC
  Administered 2021-06-10: 3 mL via INTRAVENOUS
  Administered 2021-06-11: 10 mL via INTRAVENOUS
  Administered 2021-06-12: 3 mL via INTRAVENOUS

## 2021-06-10 MED ORDER — DOCUSATE SODIUM 50 MG/5ML PO LIQD
100.0000 mg | Freq: Two times a day (BID) | ORAL | Status: DC
  Administered 2021-06-10 – 2021-06-11 (×3): 100 mg
  Filled 2021-06-10 (×3): qty 10

## 2021-06-10 MED ORDER — ORAL CARE MOUTH RINSE
15.0000 mL | OROMUCOSAL | Status: DC
  Administered 2021-06-10 – 2021-06-11 (×7): 15 mL via OROMUCOSAL

## 2021-06-10 MED ORDER — MIDAZOLAM 50MG/50ML (1MG/ML) PREMIX INFUSION
INTRAVENOUS | Status: AC
  Filled 2021-06-10: qty 50

## 2021-06-10 MED ORDER — ATORVASTATIN CALCIUM 80 MG PO TABS
80.0000 mg | ORAL_TABLET | Freq: Every day | ORAL | Status: DC
  Administered 2021-06-11: 80 mg
  Filled 2021-06-10 (×2): qty 1

## 2021-06-10 MED ORDER — ACETAMINOPHEN 325 MG PO TABS: 650.0000 mg | ORAL_TABLET | ORAL | Status: DC | PRN

## 2021-06-10 MED ORDER — LABETALOL HCL 5 MG/ML IV SOLN: 10.0000 mg | INTRAVENOUS | Status: AC | PRN

## 2021-06-10 MED ORDER — CANGRELOR BOLUS VIA INFUSION
INTRAVENOUS | Status: DC | PRN
Start: 2021-06-10 — End: 2021-06-10
  Administered 2021-06-10: 4764 ug via INTRAVENOUS

## 2021-06-10 MED ORDER — FENTANYL CITRATE (PF) 100 MCG/2ML IJ SOLN
INTRAMUSCULAR | Status: DC | PRN
  Administered 2021-06-10: 50 ug via INTRAVENOUS

## 2021-06-10 MED ORDER — AMIODARONE HCL IN DEXTROSE 360-4.14 MG/200ML-% IV SOLN
30.0000 mg/h | INTRAVENOUS | Status: DC
  Administered 2021-06-11 – 2021-06-12 (×3): 30 mg/h via INTRAVENOUS
  Filled 2021-06-10 (×4): qty 200

## 2021-06-10 MED ORDER — FENTANYL BOLUS VIA INFUSION
25.0000 ug | INTRAVENOUS | Status: DC | PRN
  Administered 2021-06-10: 50 ug via INTRAVENOUS
  Filled 2021-06-10: qty 50

## 2021-06-10 MED ORDER — MIDAZOLAM 50MG/50ML (1MG/ML) PREMIX INFUSION
1.0000 mg/h | INTRAVENOUS | Status: DC
Start: 2021-06-10 — End: 2021-06-10
  Administered 2021-06-10: 2 mg/h via INTRAVENOUS

## 2021-06-10 MED ORDER — TICAGRELOR 90 MG PO TABS: 90.0000 mg | ORAL_TABLET | Freq: Two times a day (BID) | ORAL | Status: DC

## 2021-06-10 MED ORDER — ASPIRIN 81 MG PO CHEW
81.0000 mg | CHEWABLE_TABLET | Freq: Every day | ORAL | Status: DC
  Administered 2021-06-11: 81 mg
  Filled 2021-06-10 (×2): qty 1

## 2021-06-10 MED ORDER — MIDAZOLAM BOLUS VIA INFUSION
1.0000 mg | INTRAVENOUS | Status: DC | PRN
  Administered 2021-06-10: 2 mg via INTRAVENOUS
  Administered 2021-06-10 (×2): 1 mg via INTRAVENOUS
  Filled 2021-06-10: qty 2

## 2021-06-10 SURGICAL SUPPLY — 16 items
BALLN SAPPHIRE 2.5X12 (BALLOONS) ×2
BALLN SAPPHIRE 3.0X15 (BALLOONS) ×2
BALLN SAPPHIRE ~~LOC~~ 3.75X15 (BALLOONS) ×2 IMPLANT
BALLOON SAPPHIRE 2.5X12 (BALLOONS) ×1 IMPLANT
BALLOON SAPPHIRE 3.0X15 (BALLOONS) ×1 IMPLANT
CATH INFINITI 5FR MULTPACK ANG (CATHETERS) ×2 IMPLANT
CATH VISTA GUIDE 6FR XBLAD4 (CATHETERS) ×2 IMPLANT
KIT ENCORE 26 ADVANTAGE (KITS) ×2 IMPLANT
KIT HEART LEFT (KITS) ×2 IMPLANT
PACK CARDIAC CATHETERIZATION (CUSTOM PROCEDURE TRAY) ×2 IMPLANT
SHEATH PINNACLE 6F 10CM (SHEATH) ×4 IMPLANT
STENT ONYX FRONTIER 3.5X38 (Permanent Stent) ×2 IMPLANT
TRANSDUCER W/STOPCOCK (MISCELLANEOUS) ×2 IMPLANT
TUBING CIL FLEX 10 FLL-RA (TUBING) ×2 IMPLANT
WIRE COUGAR XT STRL 190CM (WIRE) ×2 IMPLANT
WIRE EMERALD 3MM-J .035X150CM (WIRE) ×2 IMPLANT

## 2021-06-10 NOTE — Code Documentation (Signed)
Dr Royann Shivers at bedside with Dr Denton Lank

## 2021-06-10 NOTE — ED Provider Notes (Signed)
[facility] EMERGENCY DEPARTMENT Provider Note   CSN: [id number] Arrival date & time: 06/10/21  1207     History Chief Complaint  Patient presents with   Chest Pain   Shortness of Breath    Gary Sandoval is a 45 y.o. male.  Patient indicates on and off chest discomfort x 1 week, and today, while walking to vehicle, severe mid chest pain, heavy/pressure, 'like truck sitting on chest', dull, non radiating. States felt sob, clammy, nauseated, and generally weak. No associated palpitations. No pleuritic pain. Had non prod cough during episode, but denies preceding bad cough or other uri symptoms. No fever or chills. Denies leg pain or swelling. States his younger brother had MI, but he has never had any cardiac testing. No hx dvt or pe. No heartburn. No chest wall injury or strain. EMS/first responders gave asa, ntg, notes marked improvement in symptoms, with only slightest pressure sensation currently.   The history is provided by the patient and the EMS personnel.  Chest Pain Associated symptoms: nausea and shortness of breath   Associated symptoms: no abdominal pain, no back pain, no fever and no headache   Shortness of Breath Associated symptoms: chest pain   Associated symptoms: no abdominal pain, no fever, no headaches, no neck pain, no rash and no sore throat       Past Medical History:  Diagnosis Date   A-fib [facility])    Atrial fibrillation [facility])     Patient Active Problem List   Diagnosis Date Noted   TOBACCO ABUSE 05/25/2010   ATRIAL FIBRILLATION 05/23/2010    Past Surgical History:  Procedure Laterality Date   CHOLECYSTECTOMY         History reviewed. No pertinent family history.  Social History   Tobacco Use   Smoking status: Every Day    Packs/day: 1.00    Types: Cigarettes   Smokeless tobacco: Never  Vaping Use   Vaping Use: Never used  Substance Use Topics   Alcohol use: No   Drug use: No    Home Medications Prior to  Admission medications   Medication Sig Start Date End Date Taking? Authorizing Provider  Echinacea 450 MG CAPS Take by mouth.    [provider]    Allergies    Patient has no known allergies.  Review of Systems   Review of Systems  Constitutional:  Negative for chills and fever.  HENT:  Negative for sore throat.   Eyes:  Negative for redness.  Respiratory:  Positive for shortness of breath.   Cardiovascular:  Positive for chest pain.  Gastrointestinal:  Positive for nausea. Negative for abdominal pain.  Genitourinary:  Negative for flank pain.  Musculoskeletal:  Negative for back pain and neck pain.  Skin:  Negative for rash.  Neurological:  Negative for headaches.  Hematological:  Does not bruise/bleed easily.  Psychiatric/Behavioral:  Negative for confusion.    Physical Exam Updated Vital Signs BP 127/77 (BP Location: Left Arm)   Pulse 92   Temp 97.7 F (36.5 C) (Oral)   Resp 12   Ht 2.032 m (6\' 8" )   Wt (!) 158.8 kg   SpO2 97%   BMI 38.45 kg/m   Physical Exam Vitals and nursing note reviewed.  Constitutional:      Appearance: Normal appearance. He is well-developed.  HENT:     Head: Atraumatic.     Nose: Nose normal.     Mouth/Throat:     Mouth: Mucous membranes are moist.  Pharynx: Oropharynx is clear.  Eyes:     General: No scleral icterus.    Conjunctiva/sclera: Conjunctivae normal.  Neck:     Trachea: No tracheal deviation.  Cardiovascular:     Rate and Rhythm: Normal rate and regular rhythm.     Pulses: Normal pulses.     Heart sounds: Normal heart sounds. No murmur heard.   No friction rub. No gallop.  Pulmonary:     Effort: Pulmonary effort is normal. No accessory muscle usage or respiratory distress.     Breath sounds: Normal breath sounds.  Chest:     Chest wall: No tenderness.  Abdominal:     General: Bowel sounds are normal. There is no distension.     Palpations: Abdomen is soft.     Tenderness: There is no abdominal  tenderness. There is no guarding.  Genitourinary:    Comments: No cva tenderness. Musculoskeletal:        General: No swelling or tenderness.     Cervical back: Normal range of motion and neck supple. No rigidity.     Right lower leg: No edema.     Left lower leg: No edema.  Skin:    General: Skin is warm and dry.     Findings: No rash.  Neurological:     Mental Status: He is alert.     Comments: Alert, speech clear.   Psychiatric:        Mood and Affect: Mood normal.    ED Results / Procedures / Treatments   Labs (all labs ordered are listed, but only abnormal results are displayed)   EKG EKG Interpretation  Date/Time:  Friday June 10 2021 12:10:58 EDT Ventricular Rate:  90 PR Interval:  123 QRS Duration: 105 QT Interval:  388 QTC Calculation: 475 R Axis:   53 Text Interpretation: Sinus rhythm Confirmed by Cathren Laine ([id number]) on 06/10/2021 12:19:25 PM  Radiology No results found.  Procedures Procedure Name: Intubation Date/Time: 06/10/2021 12:56 PM Performed by: Cathren Laine, MD Pre-anesthesia Checklist: Patient identified, Patient being monitored, Emergency Drugs available and Suction available Oxygen Delivery Method: Non-rebreather mask Laryngoscope Size: Glidescope Tube size: 7.5 mm Number of attempts: 1 Placement Confirmation: ETT inserted through vocal cords under direct vision, CO2 detector and Breath sounds checked- equal and bilateral Secured at: 25 cm Tube secured with: ETT holder Comments: Patient unresponsive/vfib arrest. No RSI meds used. Upper dentures removed, 100% o2, quickly intubated on 1st attempt. Co2 color change and bil bs confirmed. O2 sats 100%.  Patient taken to cath lab emergently prior to pcxr - cardiologist aware, and will obtain imaging once in cath labs.       Medications Ordered in ED Medications - No data to display  ED Course  I have reviewed the triage vital signs and the nursing notes.  Pertinent labs & imaging results  that were available during my care of the patient were reviewed by me and considered in my medical decision making (see chart for details).    MDM Rules/Calculators/A&P                           Iv ns. Stat ecg and labs. Continuous pulse ox and cardiac monitoring. Pcxr.   EMS already gave asa.   EMS ecg reviewed - compared to current, some st dep inf/laterally, with relative elevation in AVR and V1 compared to current.  Given hx, fam hx, EMS ecg, etc., suspect ACS/stemi - cardiology emergently consulted, heparin  iv per pharmacy.   Discussed pt with Dr Alanson Puls who reviewed current and EMS ecg, and concern for possible LM/LAD ischemia - he is coming to see. No activation cath lab for now.   RN indicates pain/nausea return. Stat repeat ecg and went to check patient. On arrival to room, pt becomes unresponsive, vfib on monitor.  Dr Royann Shivers called back - informed of vfib arrest and need for immediate cath lab - cath lab ready. Code/crash cart activated. CPR/chest compressions begun while defib applied. GLide score/airway cart called for.  EPI iv. Amio bolus. Defib. CPR.   With initial epi/defib, remains in vfib.  CPR, EPI, amio. 2nd defib - with return of rhythm and pulses. Intubated with glidescope on 1st attempt. Strong carotid and bilateral radial pulses. Bil bs, pulse ox 100%. Stat pcxr.   Propofol for sedation. Amio gtt. Pt taken emergently to cath labs.   Labs reviewed/interpreted by me  -  trop elev.   CRITICAL CARE RE: acute chest pain w ACS/NSTEMI, VFIB Arrest with CPR/ROSC, intubation/mech vent.  Performed by: Suzi Roots Total critical care time: 35 minutes Critical care time was exclusive of separately billable procedures and treating other patients. Critical care was necessary to treat or prevent imminent or life-threatening deterioration. Critical care was time spent personally by me on the following activities: development of treatment plan with patient and/or surrogate as  well as nursing, discussions with consultants, evaluation of patient's response to treatment, examination of patient, obtaining history from patient or surrogate, ordering and performing treatments and interventions, ordering and review of laboratory studies, ordering and review of radiographic studies, pulse oximetry and re-evaluation of patient's condition.     Final Clinical Impression(s) / ED Diagnoses Final diagnoses:  None    Rx / DC Orders ED Discharge Orders     None        Cathren Laine, MD 06/10/21 1303

## 2021-06-10 NOTE — Consult Note (Signed)
NAME:  Gary Sandoval, MRN:  [id number], DOB:  1976-11-01, LOS: 0 ADMISSION DATE:  06/10/2021, CONSULTATION DATE:  06/10/21 REFERRING MD:  Tresa Endo- Cards, CHIEF COMPLAINT:  Vfib arrest    History of Present Illness:   45 yo PMH tobacco abuse, afib (episodic), obesity and strong family history of CAD presented to ED via EMS with CC chest pain and SOB. Intermittent chest pain x 1week. Today fely SOB, nauseous, weak, clammy. Clinical suspicion by EMS for ACS -- given ASA ntg with sx improvement, and ECG with EMS with  aVR V1 ST elevation, hyperacute T waves V3-V6. Cards consulted and c/f LM/LAD ischemia.  Pt had recurrence of chest pain and nausea. Became unresponsive, Vfib noted on monitor. Cath lab activated. ACLS in ED with epi defib amio defib given with ROSC. Intubated during ACLS.  Taken to cath lab.   Admission labs are grossly unremarkable.   PCCM is consulted in this setting   Pertinent  Medical History   Afib  Hypokalemia  Tobacco abuse  Significant Hospital Events: Including procedures, antibiotic start and stop dates in addition to other pertinent events   STEMI, Vfib cardiac arrest. intubated in ED during Vfib arrest. Cath lab, 1x stent to LAD  Interim History / Subjective:  Vfib arrest Cath lab with 1x stent to LAD   On arrival to ICU, on midaz, fent, prop gtts.  I turned off midaz and paused prop for exam   Objective   Blood pressure (!) 163/78, pulse (!) 115, temperature 97.7 F (36.5 C), temperature source Oral, resp. rate 18, height 6\' 8"  (2.032 m), weight (!) 158.8 kg, SpO2 100 %.       No intake or output data in the 24 hours ending 06/10/21 1350 Filed Weights   06/10/21 1219  Weight: (!) 158.8 kg    Examination: General: wdwn middle aged M, intubated sedated NAD Neuro: sedated. Purposeful movements BUE BLE head neck. Withdraws and grimaces to pain. PERRL 45mm  HENT: NCAT pink mm ETT secure anicteric sclera Lungs: CTAb mechanically ventilated, even  unlabored  Cardiovascular: rrr s1s2 no rgm cap refill brisk  Abdomen: soft round ndnt + bowel sounds x 4 GU: wnl no foley  Extremities: no acute joint deformity. No cyanosis or clubbing.  Skin: black stains/dirt on bilateral hands. No rash. Scattered healed tattoos    Resolved Hospital Problem list     Assessment & Plan:   Acute respiratory failure with hypoxia requiring intubation in setting of cardiac arrest P -ABG CXR  -VAP PAD pulm hygiene - WUA/SBT starting 7/30  Witnessed in hospital Vfib arrest with ROSC STEMI  s/p cath with 1x stent to LAD CAD -neuro exam is purposeful. No indication for ttm P -emergent cath lab -ASA brilinta per cards  -amio gtt -will come off cangrelor when heparin started -check mag and repeat BMP (has borderline low K on admission labs)  -ECHO  -statin   HTN P -PRN hydral, labetalol   Hx Afib  P -ICU monitoring  Inadequate PO intake P -if unable to extubate 7/30, consult RDN for EN   Tobacco use disorder P -needs cessation counseling   Best Practice (right click and "Reselect all SmartList Selections" daily)   Diet/type: NPO DVT prophylaxis: systemic heparin GI prophylaxis: PPI Lines: yes and it is still needed Foley:  N/A Code Status:  full code Last date of multidisciplinary goals of care discussion [pending]  Labs   CBC: Recent Labs  Lab 06/10/21 1218  WBC 11.1*  HGB  16.0  HCT 46.7  MCV 94.0  PLT 226    Basic Metabolic Panel: Recent Labs  Lab 06/10/21 1218  NA 136  K 3.6  CL 102  CO2 23  GLUCOSE 123*  BUN 10  CREATININE 1.21  CALCIUM 8.9   GFR: Estimated Creatinine Clearance: 130.7 mL/min (by C-G formula based on SCr of 1.21 mg/dL). Recent Labs  Lab 06/10/21 1218  WBC 11.1*    Liver Function Tests: Recent Labs  Lab 06/10/21 1218  AST 18  ALT 15  ALKPHOS 69  BILITOT 1.4*  PROT 6.9  ALBUMIN 4.0   No results for input(s): LIPASE, AMYLASE in the last 168 hours. No results for input(s):  AMMONIA in the last 168 hours.  ABG    Component Value Date/Time   TCO2 20 05/13/2010 0452     Coagulation Profile: No results for input(s): INR, PROTIME in the last 168 hours.  Cardiac Enzymes: No results for input(s): CKTOTAL, CKMB, CKMBINDEX, TROPONINI in the last 168 hours.  HbA1C: Hgb A1c MFr Bld  Date/Time Value Ref Range Status  05/13/2010 08:32 AM  <5.7 % Final   5.4 (NOTE)                                                                       According to the ADA Clinical Practice Recommendations for 2011, when HbA1c is used as a screening test:   >=6.5%   Diagnostic of Diabetes Mellitus           (if abnormal result  is confirmed)  5.7-6.4%   Increased risk of developing Diabetes Mellitus  References:Diagnosis and Classification of Diabetes Mellitus,Diabetes Care,2011,34(Suppl 1):S62-S69 and Standards of Medical Care in         Diabetes - 2011,Diabetes Care,2011,34  (Suppl 1):S11-S61.    CBG: No results for input(s): GLUCAP in the last 168 hours.  Review of Systems:   Unable to obtain, pt intubated  Past Medical History:  He,  has a past medical history of A-fib (HCC) and Atrial fibrillation (HCC).  Tobacco abuse   Surgical History:   Past Surgical History:  Procedure Laterality Date   CHOLECYSTECTOMY       Social History:   reports that he has been smoking cigarettes. He has been smoking an average of 1 pack per day. He has never used smokeless tobacco. He reports that he does not drink alcohol and does not use drugs.   Family History:  CAD, MI   Allergies No Known Allergies   Home Medications  Prior to Admission medications   Medication Sig Start Date End Date Taking? Authorizing Provider  Echinacea 450 MG CAPS Take by mouth.    [provider]     Critical care time: 52 min     CRITICAL CARE Performed by: Lanier Clam   Total critical care time: 52 minutes  Critical care time was exclusive of separately billable procedures and  treating other patients.  Critical care was necessary to treat or prevent imminent or life-threatening deterioration.  Critical care was time spent personally by me on the following activities: development of treatment plan with patient and/or surrogate as well as nursing, discussions with consultants, evaluation of patient's response to treatment, examination of patient, obtaining  history from patient or surrogate, ordering and performing treatments and interventions, ordering and review of laboratory studies, ordering and review of radiographic studies, pulse oximetry and re-evaluation of patient's condition.  Tessie Fass MSN, AGACNP-BC [facility] Amion for pager  06/10/2021, 3:56 PM

## 2021-06-10 NOTE — Progress Notes (Signed)
ANTICOAGULATION CONSULT NOTE - Follow Up Consult  Pharmacy Consult for heparin Indication:  post ACS   Allergies  Allergen Reactions   Codeine Other (See Comments)    Makes him act wierd    Patient Measurements: Height: 6\' 8"  (203.2 cm) Weight: (!) 158.8 kg (350 lb) IBW/kg (Calculated) : 96 Heparin Dosing Weight: 131.6 kg  Vital Signs: Temp: 97.6 F (36.4 C) (07/29 1900) Temp Source: Axillary (07/29 1900) BP: 99/78 (07/29 1900) Pulse Rate: 53 (07/29 1900)  Labs: Recent Labs    06/10/21 1218 06/10/21 1328 06/10/21 1621 06/10/21 1652  HGB 16.0 15.3 14.3  --   HCT 46.7 45.0 42.0  --   PLT 226  --   --   --   CREATININE 1.21 1.00  --  1.00  TROPONINIHS 17  --   --  1,491*    Estimated Creatinine Clearance: 158.1 mL/min (by C-G formula based on SCr of 1 mg/dL).   Medications:  Scheduled:   atropine       [START ON 06/11/2021] aspirin  81 mg Per Tube Daily   [START ON 06/11/2021] atorvastatin  80 mg Per Tube Daily   chlorhexidine gluconate (MEDLINE KIT)  15 mL Mouth Rinse BID   docusate  100 mg Per Tube BID   mouth rinse  15 mL Mouth Rinse 10 times per day   ondansetron (ZOFRAN) IV  4 mg Intravenous Once   [START ON 06/11/2021] pantoprazole sodium  40 mg Per Tube Daily   polyethylene glycol  17 g Per Tube Daily   sodium chloride flush  3 mL Intravenous Q12H   ticagrelor  90 mg Per Tube BID   Infusions:   sodium chloride     amiodarone 60 mg/hr (06/10/21 2000)   fentaNYL infusion INTRAVENOUS 50 mcg/hr (06/10/21 2000)   lactated ringers     propofol (DIPRIVAN) infusion 30 mcg/kg/min (06/10/21 2000)    Assessment: 45 yo M with STEMI on arrival to ED, pt with sudden onset Vfib arrest - was defib x2 with ROSC after 2nd def. Intubated. Taken to cath lab STAT. Amio 300mg  IV x1 during CPR, amio gtt started @ 60mg /hr.   Heparin reinitiated 10 hours after sheath d/t wall motion abnormality and thrombus per chart review. Will initiate at lower range due to recent cath  procedure (received 19,500 units prior to and during cath procedure).   Pharmacy consulted for heparin.    Goal of Therapy:  Heparin level 0.3-0.7 units/ml Monitor platelets by anticoagulation protocol: Yes   Plan:  Start heparin infusion at 12 units/kg/hr (1500 units/hr) starting at 07/30 @ 0830. Check level @ 1430.   Continue to monitor H&H and platelets    Thank you for allowing pharmacy to be a part of this patient's care.  Ardyth Harps, PharmD Clinical Pharmacist

## 2021-06-10 NOTE — H&P (Addendum)
Cardiology Admission History and Physical:   Patient ID: Gary Sandoval MRN: [id number]; DOB: [birth year]   Admission date: 06/10/2021  PCP:  Gary Peak, PA-C   [facility] Providers Cardiologist:  New - Dr. Tresa Sandoval (previously seen by Dr. Myrtis Sandoval in 2011)   Chief Complaint:  Chest pain  Patient Profile:   Gary Sandoval is a 45 y.o. male with PAF, tobacco abuse and h/o hypokalemia who is being seen 06/10/2021 for the evaluation of cardiac arrest in the setting of ACS.  History of Present Illness:   Gary Sandoval is a 45 year old male with past medical history of atrial fibrillation, tobacco abuse and hypokalemia.  Patient had a episode of paroxysmal A. fib in July 2021 and was seen by Dr. Willa Sandoval prior to his retirement.  Patient self converted on rate control medication.  Echocardiogram at the time showed normal ejection fraction.  Atrial fibrillation was felt to be related to significant caffeine intake.  He was not placed on anticoagulation therapy due to low CHA2DS2-VASc score.  Unfortunately, patient has been lost to follow-up since 2011.  He was seen by Dr. Leonette Sandoval fields of vascular surgery in May 2019 for painful bilateral lower extremity varicose veins.  Compression therapy was recommended.  He presented to [facility], ED on 06/10/2021 with intermittent chest pain since last Saturday.  Initial EKG showed no obvious ST-T wave changes, however per ED report, EKG obtained by EMS showed ST depression in the inferolateral leads with relative elevation in the aVR and V1.  EKG has been reviewed by DOD Dr. Royann Sandoval was also concerned for significant ischemia.  Cardiology service was emergently consulted.  Unfortunately, while in the emergency room, patient had worsening chest pain and the nausea.  He subsequently went into V. fib arrest and became unresponsive.  He underwent a CPR in the chest compression with successful defibrillation.  He received 1 epi, amnio bolus and 2 defibrillations  prior to achieving ROSC.  Cath Lab was emergently activated, patient moved to Cath Lab for urgent cardiac catheterization.  Prior to coming up, patient was intubated in the ER.  Majority of the history has been obtained from chart review, as the patient is currently intubated and nonresponsive.   Past Medical History:  Diagnosis Date   A-fib [facility])    Atrial fibrillation [facility])     Past Surgical History:  Procedure Laterality Date   CHOLECYSTECTOMY       Medications Prior to Admission: Prior to Admission medications   Medication Sig Start Date End Date Taking? Authorizing Provider  Echinacea 450 MG CAPS Take by mouth.    [provider]     Allergies:   No Known Allergies  Social History:   Social History   Socioeconomic History   Marital status: Single    Spouse name: Not on file   Number of children: Not on file   Years of education: Not on file   Highest education level: Not on file  Occupational History   Not on file  Tobacco Use   Smoking status: Every Day    Packs/day: 1.00    Types: Cigarettes   Smokeless tobacco: Never  Vaping Use   Vaping Use: Never used  Substance and Sexual Activity   Alcohol use: No   Drug use: No   Sexual activity: Not on file  Other Topics Concern   Not on file  Social History Narrative   Not on file   Social Determinants of Health   Financial Resource  Strain: Not on file  Food Insecurity: Not on file  Transportation Needs: Not on file  Physical Activity: Not on file  Stress: Not on file  Social Connections: Not on file  Intimate Partner Violence: Not on file    Family History:   The patient's family history is not on file.  Patient is currently intubated and sedated.   ROS:  Please see the history of present illness.  All other ROS reviewed and negative.     Physical Exam/Data:   Vitals:   06/10/21 1237 06/10/21 1238 06/10/21 1242 06/10/21 1245  BP:  (!) 138/124 (!) 162/94 (!) 163/78  Pulse: (!) 142  (!) 102  (!) 115  Resp: (!) 31  19 18   Temp:      TempSrc:      SpO2: (!) 89%  100% 100%  Weight:      Height:       No intake or output data in the 24 hours ending 06/10/21 1328 Last 3 Weights 06/10/2021 04/11/2018 06/13/2015  Weight (lbs) 350 lb 331 lb 335 lb  Weight (kg) 158.759 kg 150.141 kg 151.955 kg     Body mass index is 38.45 kg/m.  General:  Intubated and sedated HEENT: normal Lymph: no adenopathy Neck: no JVD Endocrine:  No thryomegaly Vascular: No carotid bruits; FA pulses 2+ bilaterally without bruits  Cardiac:  normal S1, S2; RRR; no murmur  Lungs:  intubated Abd: soft, no hepatomegaly  Ext: no edema Musculoskeletal:  No deformities, BUE and BLE strength normal and equal Skin: warm and dry  Neuro:  Intubated and sedated Psych:  unable to obtain   EKG:  The ECG that was done and was personally reviewed and demonstrates NSR without significant ST-T wave changes, however EKG obtained by EMS shows clear ST elevation in V1 and AVR with lateral ST depression, LBBB and hyperacute T wave  Relevant CV Studies:  Echo 05/13/2010 - Left ventricle: The cavity size was normal. Wall thickness was     normal. Systolic function was normal. The estimated ejection     fraction was in the range of 60% to 65%.   - Mitral valve: Mild regurgitation.  Laboratory Data:  High Sensitivity Troponin:   Recent Labs  Lab 06/10/21 1218  TROPONINIHS 17      Chemistry Recent Labs  Lab 06/10/21 1218  NA 136  K 3.6  CL 102  CO2 23  GLUCOSE 123*  BUN 10  CREATININE 1.21  CALCIUM 8.9  GFRNONAA >60  ANIONGAP 11    Recent Labs  Lab 06/10/21 1218  PROT 6.9  ALBUMIN 4.0  AST 18  ALT 15  ALKPHOS 69  BILITOT 1.4*   Hematology Recent Labs  Lab 06/10/21 1218  WBC 11.1*  RBC 4.97  HGB 16.0  HCT 46.7  MCV 94.0  MCH 32.2  MCHC 34.3  RDW 12.2  PLT 226   BNPNo results for input(s): BNP, PROBNP in the last 168 hours.  DDimer No results for input(s): DDIMER in the last 168  hours.   Radiology/Studies:  DG Chest Port 1 View  Result Date: 06/10/2021 CLINICAL DATA:  Chest pain and shortness of breath EXAM: PORTABLE CHEST 1 VIEW COMPARISON:  05/13/2010 FINDINGS: Two AP radiographs of the chest were obtained. There are increased interstitial markings within the semi upright view which do not persist on the upright view. Minimal linear bibasilar markings persist, likely atelectasis. Heart size is upper limits of normal, unchanged. No pleural effusion or pneumothorax. IMPRESSION: Minimal  linear bibasilar atelectasis.  Lungs are otherwise clear. Electronically Signed   By: Duanne GuessNicholas  Plundo D.O.   On: 06/10/2021 12:58     Assessment and Plan:   Acute ACS with cardiac arrest  -Patient arrived to the emergency room with complaint of chest pain since last Saturday a week ago.   -Although EKG obtained in the ED shows no acute changes, initial EKG obtained by EMS showed ST elevation in aVR and V1 with corresponding ST depression.  -Before the patient can be interviewed by cardiology service, he had V. fib arrest.  -CPR was immediately started, patient received epi, amiodarone and a 2 defibrillations before achieving ROSC.  -He was intubated and sedated in the emergency room.  -Patient was taken emergently to the Cath Lab.  Majority of the history has been obtained from chart review as the patient is intubated and sedated due to V. fib arrest.  History of paroxysmal atrial fibrillation: Diagnosed in 2011, solitary episode.  Not placed on anticoagulation therapy due to low CHA2DS2- VASC score.    Risk Assessment/Risk Scores:    TIMI Risk Score for ST  Elevation MI:   The patient's TIMI risk score is 2, which indicates a 2.2% risk of all cause mortality at 30 days.     CHA2DS2-VASc Score = 1  This indicates a 0.6% annual risk of stroke. The patient's score is based upon: CHF History: No HTN History: No Diabetes History: No Stroke History: No Vascular Disease History:  Yes Age Score: 0 Gender Score: 0      Severity of Illness: The appropriate patient status for this patient is INPATIENT. Inpatient status is judged to be reasonable and necessary in order to provide the required intensity of service to ensure the patient's safety. The patient's presenting symptoms, physical exam findings, and initial radiographic and laboratory data in the context of their chronic comorbidities is felt to place them at high risk for further clinical deterioration. Furthermore, it is not anticipated that the patient will be medically stable for discharge from the hospital within 2 midnights of admission. The following factors support the patient status of inpatient.   " The patient's presenting symptoms include chest pain. " The worrisome physical exam findings include cardiac arrest, intubated. " The initial radiographic and laboratory data are worrisome because of abnormal EKG. " The chronic co-morbidities include PAF.   * I certify that at the point of admission it is my clinical judgment that the patient will require inpatient hospital care spanning beyond 2 midnights from the point of admission due to high intensity of service, high risk for further deterioration and high frequency of surveillance required.*   For questions or updates, please contact [facility] Please consult [email or web address] for contact info under     Signed, Azalee CourseHao Meng, PA  06/10/2021 1:28 PM    I have seen and examined the patient along with Azalee CourseHao Meng, PA NP.  I have reviewed the chart, notes and new data.  I agree with PA/NP's note.  Key new complaints: unstable angina preceded acute MI for a week before presentation (per wife) Key examination changes: severely obese, intubated, sedated but moving purposefully Key new findings / data: ECG by EMS showed ST elevation in avR and V1, hyperacute T waves in V3-V6. After NTG and ASA, ECG ST changes normalized, albeit with development of incomplete RBBB;   but soon after arrival he had VF arrest, quickly and successfully resuscitated. Now on amio drip and intubated.  PLAN: Emergency cardiac cath  and PCI. High likelihood of ostial LAD occlusion. Prognosis guarded.  Thurmon Fair, MD, [facility] [phone] 06/10/2021, 2:16 PM

## 2021-06-10 NOTE — Progress Notes (Addendum)
Rt fem site starting to bleed. Direct pressure held to site x 5 min. No signs of bleeding small bruising noted. Site level 0 no hematoma. Will continue frequent monitoring of groin.

## 2021-06-10 NOTE — Progress Notes (Signed)
ANTICOAGULATION CONSULT NOTE - Initial Consult  Pharmacy Consult for heparin Indication: chest pain/ACS  No Known Allergies  Patient Measurements: Height: 6\' 8"  (203.2 cm) Weight: (!) 158.8 kg (350 lb) IBW/kg (Calculated) : 96 Heparin Dosing Weight: 131.6 kg  Vital Signs: Temp: 97.7 F (36.5 C) (07/29 1210) Temp Source: Oral (07/29 1210) BP: 127/77 (07/29 1210) Pulse Rate: 92 (07/29 1210)  Labs: No results for input(s): HGB, HCT, PLT, APTT, LABPROT, INR, HEPARINUNFRC, HEPRLOWMOCWT, CREATININE, CKTOTAL, CKMB, TROPONINIHS in the last 72 hours.  CrCl cannot be calculated (Patient's most recent lab result is older than the maximum 21 days allowed.).   Medical History: Past Medical History:  Diagnosis Date   A-fib [facility])    Atrial fibrillation [facility])    Assessment: 45 yo M with possible ST depression inferior/laterally - on arrival to room, pt with sudden onset Vfib arrest - was defib x2 with ROSC after 2nd def. Intubated. On way to cath lab STAT. Amio 300mg  IV x1 during CPR, amio gtt started @ 60mg /hr.    Pharmacy consulted for heparin. As on the way to cath lab, pharmacy to f/u after cath to address consult.  47, PharmD, [facility] Emergency Medicine Clinical Pharmacist ED RPh Phone: [phone] Main RX: [phone]

## 2021-06-10 NOTE — Progress Notes (Signed)
eLink Physician-Brief Progress Note Patient Name: Gary Sandoval DOB: September 15, 1976 MRN: [id number]   Date of Service  06/10/2021  HPI/Events of Note  Received request to insert aline as sheath is going to be removed  eICU Interventions  Patient is hemodynamically stable and cuff pressure is correlating. Will defer aline insertion for now     Intervention Category Intermediate Interventions: Other:  Darl Pikes 06/10/2021, 8:31 PM

## 2021-06-10 NOTE — ED Triage Notes (Signed)
Pt BIB medic for sudden onset CP and SOB - reports chest has been hurting x1 week, then today during ambulation chest became 10/10 pain, tightness, pressure and he began coughing and became SOB. Pt was diaphoretic, clammy and reported crushing chest pain to EMS. On arrival symptoms improved, 2/10 pressure to chest.

## 2021-06-10 NOTE — Progress Notes (Signed)
Arterial sheath removed per order. Manual pressure applied to right femoral for 20 minutes. VSS. Site assessment level 0.

## 2021-06-10 NOTE — Code Documentation (Addendum)
Pt began vomiting at 1231, RN went to obtain zofran and fluid bolus. Upon return at 1233 pt was gray, eyes rolled back, unresponsive, no pulse. Code initiated at this time, CPR initiated, Dr Denton Lank at bedside

## 2021-06-10 NOTE — Progress Notes (Signed)
Patient transported on vent from Cath Lab to [phone] without complications.

## 2021-06-11 ENCOUNTER — Inpatient Hospital Stay (HOSPITAL_COMMUNITY): Payer: 59

## 2021-06-11 ENCOUNTER — Other Ambulatory Visit (HOSPITAL_COMMUNITY): Payer: Self-pay

## 2021-06-11 DIAGNOSIS — Z978 Presence of other specified devices: Secondary | ICD-10-CM

## 2021-06-11 DIAGNOSIS — I2102 ST elevation (STEMI) myocardial infarction involving left anterior descending coronary artery: Secondary | ICD-10-CM

## 2021-06-11 LAB — BASIC METABOLIC PANEL
Anion gap: 10 (ref 5–15)
BUN: 7 mg/dL (ref 6–20)
CO2: 26 mmol/L (ref 22–32)
Calcium: 8.2 mg/dL — ABNORMAL LOW (ref 8.9–10.3)
Chloride: 102 mmol/L (ref 98–111)
Creatinine, Ser: 1.05 mg/dL (ref 0.61–1.24)
GFR, Estimated: >60 mL/min (ref >60)
Glucose, Bld: 170 mg/dL — ABNORMAL HIGH (ref 70–99)
Potassium: 3.7 mmol/L (ref 3.5–5.1)
Sodium: 138 mmol/L (ref 135–145)

## 2021-06-11 LAB — HEPATIC FUNCTION PANEL
ALT: 26 U/L (ref 0–44)
AST: 39 U/L (ref 15–41)
Albumin: 3.4 g/dL — ABNORMAL LOW (ref 3.5–5.0)
Alkaline Phosphatase: 74 U/L (ref 38–126)
Bilirubin, Direct: 0.1 mg/dL (ref 0.0–0.2)
Indirect Bilirubin: 0.6 mg/dL (ref 0.3–0.9)
Total Bilirubin: 0.7 mg/dL (ref 0.3–1.2)
Total Protein: 6 g/dL — ABNORMAL LOW (ref 6.5–8.1)

## 2021-06-11 LAB — TRIGLYCERIDES: Triglycerides: 164 mg/dL — ABNORMAL HIGH (ref <150)

## 2021-06-11 LAB — ECHOCARDIOGRAM COMPLETE
Area-P 1/2: 4.39 cm2
Calc EF: 68.9 %
Height: 80 in
S' Lateral: 3.2 cm
Single Plane A2C EF: 64.3 %
Single Plane A4C EF: 70.2 %
Weight: 5418.02 oz

## 2021-06-11 LAB — CBC
HCT: 46 % (ref 39.0–52.0)
Hemoglobin: 14.9 g/dL (ref 13.0–17.0)
MCH: 32.1 pg (ref 26.0–34.0)
MCHC: 32.4 g/dL (ref 30.0–36.0)
MCV: 99.1 fL (ref 80.0–100.0)
Platelets: 105 10*3/uL — ABNORMAL LOW (ref 150–400)
RBC: 4.64 MIL/uL (ref 4.22–5.81)
RDW: 12.8 % (ref 11.5–15.5)
WBC: 10.2 10*3/uL (ref 4.0–10.5)
nRBC: 0 % (ref 0.0–0.2)

## 2021-06-11 LAB — TROPONIN I (HIGH SENSITIVITY): Troponin I (High Sensitivity): 2477 ng/L (ref <18)

## 2021-06-11 MED ORDER — POTASSIUM CHLORIDE 20 MEQ PO PACK
40.0000 meq | PACK | Freq: Once | ORAL | Status: AC
  Administered 2021-06-11: 40 meq
  Filled 2021-06-11: qty 2

## 2021-06-11 MED ORDER — CHLORHEXIDINE GLUCONATE CLOTH 2 % EX PADS
6.0000 | MEDICATED_PAD | Freq: Every day | CUTANEOUS | Status: DC
  Administered 2021-06-11 – 2021-06-12 (×2): 6 via TOPICAL

## 2021-06-11 MED ORDER — PROPOFOL 500 MG/50ML IV EMUL
INTRAVENOUS | Status: AC
  Administered 2021-06-11: 40 ug/kg/min via INTRAVENOUS
  Filled 2021-06-11: qty 50

## 2021-06-11 MED ORDER — ORAL CARE MOUTH RINSE
15.0000 mL | Freq: Two times a day (BID) | OROMUCOSAL | Status: DC
  Administered 2021-06-11: 15 mL via OROMUCOSAL

## 2021-06-11 MED ORDER — PERFLUTREN LIPID MICROSPHERE
1.0000 mL | INTRAVENOUS | Status: AC | PRN
  Administered 2021-06-11: 2 mL via INTRAVENOUS
  Filled 2021-06-11: qty 10

## 2021-06-11 NOTE — Progress Notes (Signed)
Progress Note  Patient Name: Gary Sandoval Date of Encounter: 06/11/2021  Primary Cardiologist: None   Subjective   Agitated while weaning sedation.   Inpatient Medications    Scheduled Meds:  aspirin  81 mg Per Tube Daily   atorvastatin  80 mg Per Tube Daily   chlorhexidine gluconate (MEDLINE KIT)  15 mL Mouth Rinse BID   docusate  100 mg Per Tube BID   mouth rinse  15 mL Mouth Rinse 10 times per day   ondansetron (ZOFRAN) IV  4 mg Intravenous Once   pantoprazole sodium  40 mg Per Tube Daily   polyethylene glycol  17 g Per Tube Daily   sodium chloride flush  3 mL Intravenous Q12H   ticagrelor  90 mg Per Tube BID   Continuous Infusions:  sodium chloride 20 mL/hr at 06/11/21 0500   amiodarone 30 mg/hr (06/11/21 0500)   fentaNYL infusion INTRAVENOUS 50 mcg/hr (06/11/21 0500)   heparin     lactated ringers     propofol (DIPRIVAN) infusion 40 mcg/kg/min (06/11/21 0700)   PRN Meds: sodium chloride, acetaminophen, fentaNYL, ondansetron (ZOFRAN) IV, sodium chloride flush   Vital Signs    Vitals:   06/11/21 0500 06/11/21 0508 06/11/21 0600 06/11/21 0700  BP: 101/70  107/71 116/73  Pulse: (!) 55  (!) 56 60  Resp: _0 Temp:      TempSrc:      SpO2: 100%  100% 99%  Weight:  (!) 153.6 kg    Height:        Intake/Output Summary (Last 24 hours) at 06/11/2021 0714 Last data filed at 06/11/2021 0500 Gross per 24 hour  Intake 1965.26 ml  Output 1200 ml  Net 765.26 ml   Filed Weights   06/10/21 1219 06/11/21 0508  Weight: (!) 158.8 kg (!) 153.6 kg    Telemetry    SR with occasional brief NSVT - Personally Reviewed  ECG    SR, nonspec T wave abnl anterior leads  - Personally Reviewed  Physical Exam   GEN: intubated and sedated, mildly agitated Neck: JVD not seen Cardiac: regular rhythm, normal rate, no murmurs, rubs, or gallops. R femoral cath site c/d/i Respiratory: Clear to auscultation bilaterally. GI: Soft, nontender, non-distended +bs MS: No  edema; No deformity. Neuro:  sedated Psych: sedated  Labs    Chemistry Recent Labs  Lab 06/10/21 1218 06/10/21 1328 06/10/21 1621 06/10/21 1652  NA 136 137 138 135  K 3.6 3.3* 4.0 3.9  CL 102 103  --  105  CO2 23  --   --  23  GLUCOSE 123* 238*  --  135*  BUN 10 11  --  10  CREATININE 1.21 1.00  --  1.00  CALCIUM 8.9  --   --  8.2*  PROT 6.9  --   --   --   ALBUMIN 4.0  --   --   --   AST 18  --   --   --   ALT 15  --   --   --   ALKPHOS 69  --   --   --   BILITOT 1.4*  --   --   --   GFRNONAA >60  --   --  >60  ANIONGAP 11  --   --  7     Hematology Recent Labs  Lab 06/10/21 1218 06/10/21 1328 06/10/21 1621  WBC 11.1*  --   --   RBC  4.97  --   --   HGB 16.0 15.3 14.3  HCT 46.7 45.0 42.0  MCV 94.0  --   --   MCH 32.2  --   --   MCHC 34.3  --   --   RDW 12.2  --   --   PLT 226  --   --     Cardiac EnzymesNo results for input(s): TROPONINI in the last 168 hours. No results for input(s): TROPIPOC in the last 168 hours.   BNPNo results for input(s): BNP, PROBNP in the last 168 hours.   DDimer No results for input(s): DDIMER in the last 168 hours.   Radiology    DG Abd 1 View  Result Date: 06/10/2021 CLINICAL DATA:  NG tube placement EXAM: ABDOMEN - 1 VIEW COMPARISON:  None. FINDINGS: NG tube is in place with the tip in the descending duodenum. Nonobstructive bowel gas pattern. Prior cholecystectomy. IMPRESSION: NG tube tip in the descending duodenum. Electronically Signed   By: Rolm Baptise M.D.   On: 06/10/2021 16:39   CARDIAC CATHETERIZATION  Result Date: 06/10/2021 Formatting of this result is different from the original.   Mid LAD lesion is 80% stenosed.   Prox LAD to Mid LAD lesion is 100% stenosed.   Dist RCA lesion is 20% stenosed.   Mid RCA lesion is 20% stenosed.   A stent was successfully placed.   Post intervention, there is a 0% residual stenosis.   Post intervention, there is a 0% residual stenosis.   There is severe left ventricular systolic  dysfunction. Acute anterior wall myocardial infarction secondary to total LAD occlusion at the takeoff of the first diagonal vessel complicated by VF cardiac arrest in the emergency room with successful CPR, intubation,  and ROSC. Normal large left circumflex coronary artery and mild luminal irregularity of the dominant RCA. Severe acute LV dysfunction with EF estimated approximately 25% with severe hypokinesis to akinesis/dyskinesis of the anterolateral wall extending to the apex.  LVEDP 17 mmHg. Successful PCI to the totally occluded LAD with ultimate insertion of a 3.5 x 38 mm Resolute frontier stent postdilated to stent taper from 3.8 proximally to 3.6 at the distal aspect of the stent.  There was mild thrombus following initial stent pression at the ostium of the diagonal vessel which improved and had TIMI-3 flow. RECOMMENDATION: The patient will be admitted to to heart ICU.  Once nG-tube is placed he will be given oral Brilinta with ultimate discontinuance of cangrelor.  Continue DAPT for minimum of 12 months.  Will reinitiate heparin 10 hours post sheath removal in light of significant wall motion abnormality and thrombus at the ostium of the diagonal.  Plan 2D echo Doppler study in a.m.  Once extubated, initiate post MI ARB therapy as BP allows with possible need to transition to [facility] if LV function does not improve.  Pulmonary critical care will manage the patient's sedation and ventilation.  Initiate aggressive lipid-lowering therapy.  Smoking cessation is essential.   DG CHEST PORT 1 VIEW  Result Date: 06/10/2021 CLINICAL DATA:  Chest pain, shortness of breath EXAM: PORTABLE CHEST 1 VIEW COMPARISON:  06/10/2021 FINDINGS: NG tube coils in the stomach. Heart and mediastinal contours are within normal limits. No focal opacities or effusions. No acute bony abnormality. IMPRESSION: No active disease. Electronically Signed   By: Rolm Baptise M.D.   On: 06/10/2021 16:37   DG Chest Port 1 View  Result  Date: 06/10/2021 CLINICAL DATA:  Chest pain and shortness  of breath EXAM: PORTABLE CHEST 1 VIEW COMPARISON:  05/13/2010 FINDINGS: Two AP radiographs of the chest were obtained. There are increased interstitial markings within the semi upright view which do not persist on the upright view. Minimal linear bibasilar markings persist, likely atelectasis. Heart size is upper limits of normal, unchanged. No pleural effusion or pneumothorax. IMPRESSION: Minimal linear bibasilar atelectasis.  Lungs are otherwise clear. Electronically Signed   By: Davina Poke D.O.   On: 06/10/2021 12:58    Cardiac Studies   Cath: Mid LAD lesion is 80% stenosed.   Prox LAD to Mid LAD lesion is 100% stenosed.   Dist RCA lesion is 20% stenosed.   Mid RCA lesion is 20% stenosed.   A stent was successfully placed.   Post intervention, there is a 0% residual stenosis.   Post intervention, there is a 0% residual stenosis.   There is severe left ventricular systolic dysfunction.   Acute anterior wall myocardial infarction secondary to total LAD occlusion at the takeoff of the first diagonal vessel complicated by VF cardiac arrest in the emergency room with successful CPR, intubation,  and ROSC.   Normal large left circumflex coronary artery and mild luminal irregularity of the dominant RCA.   Severe acute LV dysfunction with EF estimated approximately 25% with severe hypokinesis to akinesis/dyskinesis of the anterolateral wall extending to the apex.  LVEDP 17 mmHg.   Successful PCI to the totally occluded LAD with ultimate insertion of a 3.5 x 38 mm Resolute frontier stent postdilated to stent taper from 3.8 proximally to 3.6 at the distal aspect of the stent.  There was mild thrombus following initial stent pression at the ostium of the diagonal vessel which improved and had TIMI-3 flow.   RECOMMENDATION: The patient will be admitted to to heart ICU.  Once nG-tube is placed he will be given oral Brilinta with ultimate  discontinuance of cangrelor.  Continue DAPT for minimum of 12 months.  Will reinitiate heparin 10 hours post sheath removal in light of significant wall motion abnormality and thrombus at the ostium of the diagonal.  Plan 2D echo Doppler study in a.m.  Once extubated, initiate post MI ARB therapy as BP allows with possible need to transition to [facility] if LV function does not improve.  Pulmonary critical care will manage the patient's sedation and ventilation.  Initiate aggressive lipid-lowering therapy.  Smoking cessation is essential.  Patient Profile     45 y.o. male PAF, tobacco abuse and h/o hypokalemia for the evaluation of cardiac arrest in the setting of ACS.   Assessment & Plan   Principal Problem:   Acute ST elevation myocardial infarction (STEMI) due to occlusion of left anterior descending (LAD) coronary artery (HCC) Active Problems:   VF (ventricular fibrillation) ([place])   Cardiac arrest ([place])   STEMI with VF arrest CAD with occluded LAD -Continues on IV amiodarone, occasional brief episodes of nonsustained VT.  No  recurrent VT/VF. - IV heparin was resumed post sheath removal, Plt count decreased from 226,000 to 105,000. Will hold heparin  -Remains intubated on fentanyl and propofol, bedside nurses attempting to wean. -Mild bradycardia post procedure yesterday, not currently on BB. Will consider adding low dose prior to dismissal and pending echo and telemetry monitoring.  - asa 81 mg daily and ticagrelor 90 mg BID, at least 12 mo - atorvastatin 80 mg daily - med mgmt of residual CAD   Mild hypokalemia  - replete with Kcl 40 meq per tube.  Agitation - soft  wrist restraints and hand mitts on.  - weaning sedation.  Urinary retention - 1000 mL removed by cath yesterday, monitor output.  - net pos +765 ml this am.    For questions or updates, please contact [place] Please consult [email or web address] for contact info under        Signed, Elouise Munroe, MD   06/11/2021, 7:14 AM    CRITICAL CARE Performed by: Cherlynn Kaiser, MD   Total critical care time: 35 minutes   Critical care time was exclusive of separately billable procedures and treating other patients.   Critical care was necessary to treat or prevent imminent or life-threatening deterioration.   Critical care was time spent personally by me (independent of APPs or residents) on the following activities: development of treatment plan with patient and/or surrogate as well as nursing, discussions with consultants, evaluation of patient's response to treatment, examination of patient, obtaining history from patient or surrogate, ordering and performing treatments and interventions, ordering and review of laboratory studies, ordering and review of radiographic studies, pulse oximetry and re-evaluation of patient's condition.

## 2021-06-11 NOTE — Progress Notes (Signed)
Bladder scanned 628 ml in bladder. I&O cath using sterile technique per protocol. Peri care completed prior to cath. 900 ml retrieved from bladder. Second RN assisted at bedside.

## 2021-06-11 NOTE — Progress Notes (Signed)
  Echocardiogram 2D Echocardiogram has been performed.  Janalyn Harder 06/11/2021, 3:44 PM

## 2021-06-11 NOTE — Progress Notes (Signed)
NAME:  MAKHARI DOVIDIO, MRN:  [id number], DOB:  [birth year], LOS: 1 ADMISSION DATE:  06/10/2021, CONSULTATION DATE:  06/10/21 REFERRING MD:  Tresa Endo- Cards, CHIEF COMPLAINT:  Vfib arrest    History of Present Illness:   45 yo PMH tobacco abuse, afib (episodic), obesity and strong family history of CAD presented to ED via EMS with CC chest pain and SOB. Intermittent chest pain x 1week. Today fely SOB, nauseous, weak, clammy. Clinical suspicion by EMS for ACS -- given ASA ntg with sx improvement, and ECG with EMS with  aVR V1 ST elevation, hyperacute T waves V3-V6. Cards consulted and c/f LM/LAD ischemia.  Pt had recurrence of chest pain and nausea. Became unresponsive, Vfib noted on monitor. Cath lab activated. ACLS in ED with epi defib amio defib given with ROSC. Intubated during ACLS.  Taken to cath lab.   Admission labs are grossly unremarkable.   PCCM is consulted in this setting   Pertinent  Medical History   Afib  Hypokalemia  Tobacco abuse  Significant Hospital Events: Including procedures, antibiotic start and stop dates in addition to other pertinent events   STEMI, Vfib cardiac arrest. intubated in ED during Vfib arrest. Cath lab, 1x stent to LAD  Interim History / Subjective:  Overnight agitated needing propofol and fentanyl. SBT this morning, doing well. Moving purposefully  Objective   Blood pressure 118/70, pulse 60, temperature 97.8 F (36.6 C), resp. rate 13, height 6\' 8"  (2.032 m), weight (!) 153.6 kg, SpO2 99 %.    Vent Mode: PSV;CPAP FiO2 (%):  [40 %-100 %] 40 % Set Rate:  [18 bmp] 18 bmp Vt Set:  [630 mL] 630 mL PEEP:  [5 cmH20-8 cmH20] 5 cmH20 Pressure Support:  [10 cmH20] 10 cmH20 Plateau Pressure:  [17 cmH20-20 cmH20] 17 cmH20   Intake/Output Summary (Last 24 hours) at 06/11/2021 0941 Last data filed at 06/11/2021 0500 Gross per 24 hour  Intake 1965.26 ml  Output 1200 ml  Net 765.26 ml   Filed Weights   06/10/21 1219 06/11/21 0508  Weight: (!) 158.8 kg  (!) 153.6 kg    Examination: General: intubated, sedated, tall stature Neuro: moving all 4 extremities purposefully, RAAS -1 HENT: ETT to vent Lungs: clear, non labored Cardiovascular: RRR no mrg Abdomen: soft round ndnt + bowel sounds x 4 GU: external catheter in place Extremities: no acute joint deformity. No cyanosis or clubbing.  Skin: no rashes  Labs personally reviewed  - troponin elevated, peaked at 2477 this morning TG 164 K 3.7 Cr 1.05 Hgb 14.9  Platelets 105  Resolved Hospital Problem list     Assessment & Plan:   Acute respiratory failure with hypoxia requiring intubation in setting of cardiac arrest P -VAP PAD pulm hygiene - SBT/SAT with plans to extubate this morning  Witnessed in hospital Vfib arrest with ROSC STEMI  s/p cath with 1x stent to LAD CAD P -DAPT per cardiology - hemodynamically stable - statin - heparin d/ced due to thrombocytopenia HTN P -PRN hydral, labetalol   Hx Afib  P -ICU monitoring  Inadequate PO intake P -if unable to extubate 7/30, consult RDN for EN   Tobacco use disorder P -needs cessation counseling   Best Practice (right click and "Reselect all SmartList Selections" daily)   Diet/type: NPO DVT prophylaxis: systemic heparin GI prophylaxis: PPI Lines: yes and it is still needed Foley:  N/A Code Status:  full code Last date of multidisciplinary goals of care discussion [pending]  Labs   CBC:  Recent Labs  Lab 06/10/21 1218 06/10/21 1328 06/10/21 1621 06/11/21 0645  WBC 11.1*  --   --  10.2  HGB 16.0 15.3 14.3 14.9  HCT 46.7 45.0 42.0 46.0  MCV 94.0  --   --  99.1  PLT 226  --   --  105*    Basic Metabolic Panel: Recent Labs  Lab 06/10/21 1218 06/10/21 1328 06/10/21 1621 06/10/21 1652 06/11/21 0645  NA 136 137 138 135 138  K 3.6 3.3* 4.0 3.9 3.7  CL 102 103  --  105 102  CO2 23  --   --  23 26  GLUCOSE 123* 238*  --  135* 170*  BUN 10 11  --  10 7  CREATININE 1.21 1.00  --  1.00 1.05   CALCIUM 8.9  --   --  8.2* 8.2*  MG  --   --   --  2.4  --    GFR: Estimated Creatinine Clearance: 148 mL/min (by C-G formula based on SCr of 1.05 mg/dL). Recent Labs  Lab 06/10/21 1218 06/11/21 0645  WBC 11.1* 10.2    Liver Function Tests: Recent Labs  Lab 06/10/21 1218 06/11/21 0645  AST 18 39  ALT 15 26  ALKPHOS 69 74  BILITOT 1.4* 0.7  PROT 6.9 6.0*  ALBUMIN 4.0 3.4*   No results for input(s): LIPASE, AMYLASE in the last 168 hours. No results for input(s): AMMONIA in the last 168 hours.  ABG    Component Value Date/Time   PHART 7.367 06/10/2021 1621   PCO2ART 41.9 06/10/2021 1621   PO2ART 167 (H) 06/10/2021 1621   HCO3 24.3 06/10/2021 1621   TCO2 26 06/10/2021 1621   ACIDBASEDEF 1.0 06/10/2021 1621   O2SAT 99.0 06/10/2021 1621     Coagulation Profile: No results for input(s): INR, PROTIME in the last 168 hours.  Cardiac Enzymes: No results for input(s): CKTOTAL, CKMB, CKMBINDEX, TROPONINI in the last 168 hours.  HbA1C: Hgb A1c MFr Bld  Date/Time Value Ref Range Status  05/13/2010 08:32 AM  <5.7 % Final   5.4 (NOTE)                                                                       According to the ADA Clinical Practice Recommendations for 2011, when HbA1c is used as a screening test:   >=6.5%   Diagnostic of Diabetes Mellitus           (if abnormal result  is confirmed)  5.7-6.4%   Increased risk of developing Diabetes Mellitus  References:Diagnosis and Classification of Diabetes Mellitus,Diabetes Care,2011,34(Suppl 1):S62-S69 and Standards of Medical Care in         Diabetes - 2011,Diabetes Care,2011,34  (Suppl 1):S11-S61.       Critical care time: 52 min     CRITICAL CARE  The patient is critically ill due to STEMI, respiratory failure.  Critical care was necessary to treat or prevent imminent or life-threatening deterioration.  Critical care was time spent personally by me on the following activities: development of treatment plan with  patient and/or surrogate as well as nursing, discussions with consultants, evaluation of patient's response to treatment, examination of patient, obtaining history from patient or surrogate, ordering and performing  treatments and interventions, ordering and review of laboratory studies, ordering and review of radiographic studies, pulse oximetry, re-evaluation of patient's condition and participation in multidisciplinary rounds.   Critical Care Time devoted to patient care services described in this note is 35 minutes. This time reflects time of care of this signee Charlott Holler . This critical care time does not reflect separately billable procedures or procedure time, teaching time or supervisory time of PA/NP/Med student/Med Resident etc but could involve care discussion time.       Mickel Baas [facility] 06/11/2021 9:41 AM  Pager: see AMION  If no response to pager , please call critical care on call (see AMION) until 7pm After 7:00 pm call Elink

## 2021-06-11 NOTE — Plan of Care (Signed)
   Problem: Activity: Goal: Risk for activity intolerance will decrease 06/11/2021 2156 by Reinaldo Berber, RN Outcome: Progressing Note: Pt is ambulating in room without difficulty, will continue to progress 06/11/2021 2154 by Reinaldo Berber, RN Outcome: Progressing   Problem: Nutrition: Goal: Adequate nutrition will be maintained 06/11/2021 2156 by Reinaldo Berber, RN Outcome: Progressing Note: Pt with good appetite tonight 06/11/2021 2154 by Reinaldo Berber, RN Outcome: Progressing   Problem: Coping: Goal: Level of anxiety will decrease 06/11/2021 2156 by Reinaldo Berber, RN Outcome: Progressing Note: Pt calmly reflecting and asking questions related to STEMI and events of early medical care/hopitalization 06/11/2021 2154 by Reinaldo Berber, RN Outcome: Progressing Note: Pt calmly reflecting and asking questions regarding events leading to and early hospitalization   Problem: Clinical Measurements: Goal: Ability to maintain clinical measurements within normal limits will improve 06/11/2021 2156 by Reinaldo Berber, RN Outcome: Progressing 06/11/2021 2154 by Reinaldo Berber, RN Outcome: Progressing Goal: Will remain free from infection 06/11/2021 2156 by Reinaldo Berber, RN Outcome: Progressing 06/11/2021 2154 by Reinaldo Berber, RN Outcome: Progressing Goal: Diagnostic test results will improve 06/11/2021 2156 by Reinaldo Berber, RN Outcome: Progressing 06/11/2021 2154 by Reinaldo Berber, RN Outcome: Progressing Goal: Respiratory complications will improve 06/11/2021 2156 by Reinaldo Berber, RN Outcome: Progressing Note: Pt extubated and weaned off oxygen today 06/11/2021 2154 by Reinaldo Berber, RN Outcome: Progressing Goal: Cardiovascular complication will be avoided 06/11/2021 2156 by Reinaldo Berber, RN Outcome: Progressing 06/11/2021 2154 by Reinaldo Berber, RN Outcome: Progressing   Problem: Pain Managment: Goal: General experience of  comfort will improve 06/11/2021 2156 by Reinaldo Berber, RN Outcome: Progressing 06/11/2021 2154 by Reinaldo Berber, RN Outcome: Progressing   Problem: Safety: Goal: Ability to remain free from injury will improve 06/11/2021 2156 by Reinaldo Berber, RN Outcome: Progressing 06/11/2021 2154 by Reinaldo Berber, RN Outcome: Progressing   Problem: Skin Integrity: Goal: Risk for impaired skin integrity will decrease 06/11/2021 2156 by Reinaldo Berber, RN Outcome: Progressing 06/11/2021 2154 by Reinaldo Berber, RN Outcome: Progressing   Problem: Elimination: Goal: Will not experience complications related to urinary retention Outcome: Completed/Met

## 2021-06-11 NOTE — Plan of Care (Signed)
Pt extubated today, no longer restrained

## 2021-06-11 NOTE — Plan of Care (Signed)
  Problem: Pain Managment: Goal: General experience of comfort will improve Outcome: Progressing   Problem: Activity: Goal: Ability to return to baseline activity level will improve Outcome: Progressing   Problem: Cardiovascular: Goal: Ability to achieve and maintain adequate cardiovascular perfusion will improve Outcome: Progressing Goal: Vascular access site(s) Level 0-1 will be maintained Outcome: Progressing

## 2021-06-12 DIAGNOSIS — R338 Other retention of urine: Secondary | ICD-10-CM

## 2021-06-12 DIAGNOSIS — F17200 Nicotine dependence, unspecified, uncomplicated: Secondary | ICD-10-CM

## 2021-06-12 DIAGNOSIS — R001 Bradycardia, unspecified: Secondary | ICD-10-CM

## 2021-06-12 DIAGNOSIS — I249 Acute ischemic heart disease, unspecified: Secondary | ICD-10-CM

## 2021-06-12 LAB — CBC WITH DIFFERENTIAL/PLATELET
Abs Immature Granulocytes: 0.03 10*3/uL (ref 0.00–0.07)
Basophils Absolute: 0 10*3/uL (ref 0.0–0.1)
Basophils Relative: 1 %
Eosinophils Absolute: 0.1 10*3/uL (ref 0.0–0.5)
Eosinophils Relative: 1 %
HCT: 41.2 % (ref 39.0–52.0)
Hemoglobin: 14.1 g/dL (ref 13.0–17.0)
Immature Granulocytes: 0 %
Lymphocytes Relative: 21 %
Lymphs Abs: 1.8 10*3/uL (ref 0.7–4.0)
MCH: 32.2 pg (ref 26.0–34.0)
MCHC: 34.2 g/dL (ref 30.0–36.0)
MCV: 94.1 fL (ref 80.0–100.0)
Monocytes Absolute: 0.8 10*3/uL (ref 0.1–1.0)
Monocytes Relative: 9 %
Neutro Abs: 5.9 10*3/uL (ref 1.7–7.7)
Neutrophils Relative %: 68 %
Platelets: 167 10*3/uL (ref 150–400)
RBC: 4.38 MIL/uL (ref 4.22–5.81)
RDW: 12.4 % (ref 11.5–15.5)
WBC: 8.6 10*3/uL (ref 4.0–10.5)
nRBC: 0 % (ref 0.0–0.2)

## 2021-06-12 LAB — BASIC METABOLIC PANEL
Anion gap: 7 (ref 5–15)
BUN: 6 mg/dL (ref 6–20)
CO2: 24 mmol/L (ref 22–32)
Calcium: 8.5 mg/dL — ABNORMAL LOW (ref 8.9–10.3)
Chloride: 104 mmol/L (ref 98–111)
Creatinine, Ser: 1 mg/dL (ref 0.61–1.24)
GFR, Estimated: >60 mL/min (ref >60)
Glucose, Bld: 125 mg/dL — ABNORMAL HIGH (ref 70–99)
Potassium: 3.7 mmol/L (ref 3.5–5.1)
Sodium: 135 mmol/L (ref 135–145)

## 2021-06-12 LAB — LIPID PANEL
Cholesterol: 201 mg/dL — ABNORMAL HIGH (ref 0–200)
HDL: 27 mg/dL — ABNORMAL LOW (ref >40)
LDL Cholesterol: 152 mg/dL — ABNORMAL HIGH (ref 0–99)
Total CHOL/HDL Ratio: 7.4 RATIO
Triglycerides: 108 mg/dL (ref <150)
VLDL: 22 mg/dL (ref 0–40)

## 2021-06-12 MED ORDER — TICAGRELOR 90 MG PO TABS: 90.0000 mg | ORAL_TABLET | Freq: Two times a day (BID) | ORAL | 3 refills | Status: DC

## 2021-06-12 MED ORDER — PANTOPRAZOLE SODIUM 40 MG PO TBEC: 40.0000 mg | DELAYED_RELEASE_TABLET | Freq: Every day | ORAL | 3 refills | Status: DC

## 2021-06-12 MED ORDER — METOPROLOL TARTRATE 12.5 MG HALF TABLET: 12.5000 mg | ORAL_TABLET | Freq: Two times a day (BID) | ORAL | Status: DC

## 2021-06-12 MED ORDER — METOPROLOL TARTRATE 25 MG PO TABS: 12.5000 mg | ORAL_TABLET | Freq: Two times a day (BID) | ORAL | 3 refills | Status: DC

## 2021-06-12 MED ORDER — AMIODARONE HCL 200 MG PO TABS: 200.0000 mg | ORAL_TABLET | Freq: Every day | ORAL | 3 refills | Status: DC

## 2021-06-12 MED ORDER — METOPROLOL TARTRATE 25 MG PO TABS: 25.0000 mg | ORAL_TABLET | Freq: Two times a day (BID) | ORAL | 3 refills | Status: DC

## 2021-06-12 MED ORDER — METOPROLOL TARTRATE 25 MG PO TABS
25.0000 mg | ORAL_TABLET | Freq: Two times a day (BID) | ORAL | Status: DC
  Filled 2021-06-12: qty 1

## 2021-06-12 MED ORDER — TICAGRELOR 90 MG PO TABS
90.0000 mg | ORAL_TABLET | Freq: Two times a day (BID) | ORAL | Status: DC
  Administered 2021-06-12: 90 mg via ORAL

## 2021-06-12 MED ORDER — NITROGLYCERIN 0.4 MG SL SUBL: 0.4000 mg | SUBLINGUAL_TABLET | SUBLINGUAL | 3 refills | Status: AC | PRN

## 2021-06-12 MED ORDER — AMIODARONE HCL 200 MG PO TABS
200.0000 mg | ORAL_TABLET | Freq: Every day | ORAL | Status: DC
  Administered 2021-06-12: 200 mg via ORAL
  Filled 2021-06-12: qty 1

## 2021-06-12 MED ORDER — ASPIRIN EC 81 MG PO TBEC: 81.0000 mg | DELAYED_RELEASE_TABLET | Freq: Every day | ORAL | 2 refills | Status: AC

## 2021-06-12 MED ORDER — POTASSIUM CHLORIDE CRYS ER 20 MEQ PO TBCR
40.0000 meq | EXTENDED_RELEASE_TABLET | Freq: Once | ORAL | Status: AC
  Administered 2021-06-12: 40 meq via ORAL
  Filled 2021-06-12: qty 2

## 2021-06-12 MED ORDER — ATORVASTATIN CALCIUM 80 MG PO TABS: 80.0000 mg | ORAL_TABLET | Freq: Every day | ORAL | 3 refills | Status: DC

## 2021-06-12 MED ORDER — ATORVASTATIN CALCIUM 80 MG PO TABS
80.0000 mg | ORAL_TABLET | Freq: Every day | ORAL | Status: DC
  Administered 2021-06-12: 80 mg via ORAL

## 2021-06-12 MED ORDER — PANTOPRAZOLE SODIUM 40 MG PO TBEC
40.0000 mg | DELAYED_RELEASE_TABLET | Freq: Every day | ORAL | Status: DC
  Administered 2021-06-12: 40 mg via ORAL
  Filled 2021-06-12: qty 1

## 2021-06-12 MED ORDER — ASPIRIN 81 MG PO CHEW
81.0000 mg | CHEWABLE_TABLET | Freq: Every day | ORAL | Status: DC
  Administered 2021-06-12: 81 mg via ORAL

## 2021-06-12 NOTE — Progress Notes (Signed)
Progress Note  Patient Name: Gary Sandoval Date of Encounter: 06/12/2021  Primary Cardiologist: None   Subjective   Agitated while weaning sedation.   Inpatient Medications    Scheduled Meds:  amiodarone  200 mg Oral Daily   aspirin  81 mg Oral Daily   atorvastatin  80 mg Oral Daily   Chlorhexidine Gluconate Cloth  6 each Topical Daily   ondansetron (ZOFRAN) IV  4 mg Intravenous Once   pantoprazole  40 mg Oral Daily   sodium chloride flush  3 mL Intravenous Q12H   ticagrelor  90 mg Oral BID   Continuous Infusions:  sodium chloride Stopped (06/11/21 0844)   lactated ringers     PRN Meds: sodium chloride, acetaminophen, ondansetron (ZOFRAN) IV, sodium chloride flush   Vital Signs    Vitals:   06/12/21 0827 06/12/21 0828 06/12/21 0829 06/12/21 0830  BP:      Pulse: 67 77 82 77  Resp: 20 (!) 21 (!) 21 18  Temp:      TempSrc:      SpO2: 94% 93% 93% 94%  Weight:      Height:        Intake/Output Summary (Last 24 hours) at 06/12/2021 0954 Last data filed at 06/12/2021 0800 Gross per 24 hour  Intake 1805.51 ml  Output 900 ml  Net 905.51 ml   Filed Weights   06/10/21 1219 06/11/21 0508 06/12/21 0630  Weight: (!) 158.8 kg (!) 153.6 kg (!) 154.2 kg    Telemetry    SR with occasional brief NSVT - Personally Reviewed  ECG    SR, nonspec T wave abnl anterior leads  - Personally Reviewed  Physical Exam   GEN: No acute distress.   Neck: No JVD Cardiac: RRR, no murmurs, rubs, or gallops.  Respiratory: Clear to auscultation bilaterally. GI: Soft, nontender, non-distended  MS: No edema; No deformity. Neuro:  Nonfocal  Psych: Normal affect    Labs    Chemistry Recent Labs  Lab 06/10/21 1218 06/10/21 1328 06/10/21 1621 06/10/21 1652 06/11/21 0645  NA 136 137 138 135 138  K 3.6 3.3* 4.0 3.9 3.7  CL 102 103  --  105 102  CO2 23  --   --  23 26  GLUCOSE 123* 238*  --  135* 170*  BUN 10 11  --  10 7  CREATININE 1.21 1.00  --  1.00 1.05  CALCIUM  8.9  --   --  8.2* 8.2*  PROT 6.9  --   --   --  6.0*  ALBUMIN 4.0  --   --   --  3.4*  AST 18  --   --   --  39  ALT 15  --   --   --  26  ALKPHOS 69  --   --   --  74  BILITOT 1.4*  --   --   --  0.7  GFRNONAA >60  --   --  >60 >60  ANIONGAP 11  --   --  7 10     Hematology Recent Labs  Lab 06/10/21 1218 06/10/21 1328 06/10/21 1621 06/11/21 0645  WBC 11.1*  --   --  10.2  RBC 4.97  --   --  4.64  HGB 16.0 15.3 14.3 14.9  HCT 46.7 45.0 42.0 46.0  MCV 94.0  --   --  99.1  MCH 32.2  --   --  32.1  MCHC 34.3  --   --  32.4  RDW 12.2  --   --  12.8  PLT 226  --   --  105*    Cardiac EnzymesNo results for input(s): TROPONINI in the last 168 hours. No results for input(s): TROPIPOC in the last 168 hours.   BNPNo results for input(s): BNP, PROBNP in the last 168 hours.   DDimer No results for input(s): DDIMER in the last 168 hours.   Radiology    DG Abd 1 View  Result Date: 06/10/2021 CLINICAL DATA:  NG tube placement EXAM: ABDOMEN - 1 VIEW COMPARISON:  None. FINDINGS: NG tube is in place with the tip in the descending duodenum. Nonobstructive bowel gas pattern. Prior cholecystectomy. IMPRESSION: NG tube tip in the descending duodenum. Electronically Signed   By: Charlett Nose M.D.   On: 06/10/2021 16:39   CARDIAC CATHETERIZATION  Result Date: 06/10/2021 Formatting of this result is different from the original.   Mid LAD lesion is 80% stenosed.   Prox LAD to Mid LAD lesion is 100% stenosed.   Dist RCA lesion is 20% stenosed.   Mid RCA lesion is 20% stenosed.   A stent was successfully placed.   Post intervention, there is a 0% residual stenosis.   Post intervention, there is a 0% residual stenosis.   There is severe left ventricular systolic dysfunction. Acute anterior wall myocardial infarction secondary to total LAD occlusion at the takeoff of the first diagonal vessel complicated by VF cardiac arrest in the emergency room with successful CPR, intubation,  and ROSC. Normal large  left circumflex coronary artery and mild luminal irregularity of the dominant RCA. Severe acute LV dysfunction with EF estimated approximately 25% with severe hypokinesis to akinesis/dyskinesis of the anterolateral wall extending to the apex.  LVEDP 17 mmHg. Successful PCI to the totally occluded LAD with ultimate insertion of a 3.5 x 38 mm Resolute frontier stent postdilated to stent taper from 3.8 proximally to 3.6 at the distal aspect of the stent.  There was mild thrombus following initial stent pression at the ostium of the diagonal vessel which improved and had TIMI-3 flow. RECOMMENDATION: The patient will be admitted to to heart ICU.  Once nG-tube is placed he will be given oral Brilinta with ultimate discontinuance of cangrelor.  Continue DAPT for minimum of 12 months.  Will reinitiate heparin 10 hours post sheath removal in light of significant wall motion abnormality and thrombus at the ostium of the diagonal.  Plan 2D echo Doppler study in a.m.  Once extubated, initiate post MI ARB therapy as BP allows with possible need to transition to [facility] if LV function does not improve.  Pulmonary critical care will manage the patient's sedation and ventilation.  Initiate aggressive lipid-lowering therapy.  Smoking cessation is essential.   DG CHEST PORT 1 VIEW  Result Date: 06/10/2021 CLINICAL DATA:  Chest pain, shortness of breath EXAM: PORTABLE CHEST 1 VIEW COMPARISON:  06/10/2021 FINDINGS: NG tube coils in the stomach. Heart and mediastinal contours are within normal limits. No focal opacities or effusions. No acute bony abnormality. IMPRESSION: No active disease. Electronically Signed   By: Charlett Nose M.D.   On: 06/10/2021 16:37   DG Chest Port 1 View  Result Date: 06/10/2021 CLINICAL DATA:  Chest pain and shortness of breath EXAM: PORTABLE CHEST 1 VIEW COMPARISON:  05/13/2010 FINDINGS: Two AP radiographs of the chest were obtained. There are increased interstitial markings within the semi upright  view which do not persist on the upright view. Minimal linear bibasilar markings  persist, likely atelectasis. Heart size is upper limits of normal, unchanged. No pleural effusion or pneumothorax. IMPRESSION: Minimal linear bibasilar atelectasis.  Lungs are otherwise clear. Electronically Signed   By: Duanne Guess D.O.   On: 06/10/2021 12:58   ECHOCARDIOGRAM COMPLETE  Result Date: 06/11/2021    ECHOCARDIOGRAM REPORT   Patient Name:   Gary Sandoval Date of Exam: 06/11/2021 Medical Rec #:  [id number]       Height:       80.0 in Accession #:    [id number]      Weight:       338.6 lb Date of Birth:  01-Mar-1976       BSA:          2.876 m Patient Age:    44 years        BP:           121/63 mmHg Patient Gender: M               HR:           86 bpm. Exam Location:  Inpatient Procedure: 2D Echo, Cardiac Doppler, Color Doppler and Intracardiac            Opacification Agent Indications:    122-I22.9 Subsequent ST elevation (STEM) and non-ST elevation                 (NSTEMI) myocardial infarction  History:        Patient has no prior history of Echocardiogram examinations.                 Acute MI, Abnormal ECG, Arrythmias:Cardiac Arrest and                 Ventricular Fibrillation, Signs/Symptoms:Chest Pain; Risk                 Factors:Current Smoker.  Sonographer:    Sheralyn Boatman RDCS Referring Phys: [phone] THOMAS A KELLY  Sonographer Comments: Technically difficult study due to poor echo windows and patient is morbidly obese. Image acquisition challenging due to patient body habitus. Patient is post cath. IMPRESSIONS  1. Left ventricular ejection fraction, by estimation, is 60 to 65%. The left ventricle has normal function. The left ventricle has no regional wall motion abnormalities. There is mild concentric left ventricular hypertrophy. Left ventricular diastolic parameters were normal.  2. Right ventricular systolic function is normal. The right ventricular size is normal. There is normal pulmonary artery systolic  pressure.  3. The mitral valve is normal in structure. Trivial mitral valve regurgitation. No evidence of mitral stenosis.  4. The aortic valve is tricuspid. Aortic valve regurgitation is not visualized. No aortic stenosis is present.  5. The inferior vena cava is normal in size with greater than 50% respiratory variability, suggesting right atrial pressure of 3 mmHg. FINDINGS  Left Ventricle: Left ventricular ejection fraction, by estimation, is 60 to 65%. The left ventricle has normal function. The left ventricle has no regional wall motion abnormalities. Definity contrast agent was given IV to delineate the left ventricular  endocardial borders. The left ventricular internal cavity size was normal in size. There is mild concentric left ventricular hypertrophy. Left ventricular diastolic parameters were normal. Indeterminate filling pressures. Right Ventricle: The right ventricular size is normal. No increase in right ventricular wall thickness. Right ventricular systolic function is normal. There is normal pulmonary artery systolic pressure. The tricuspid regurgitant velocity is 1.77 m/s, and  with an assumed right atrial pressure of 3  mmHg, the estimated right ventricular systolic pressure is 15.5 mmHg. Left Atrium: Left atrial size was normal in size. Right Atrium: Right atrial size was normal in size. Pericardium: There is no evidence of pericardial effusion. Mitral Valve: The mitral valve is normal in structure. Trivial mitral valve regurgitation. No evidence of mitral valve stenosis. Tricuspid Valve: The tricuspid valve is normal in structure. Tricuspid valve regurgitation is trivial. No evidence of tricuspid stenosis. Aortic Valve: The aortic valve is tricuspid. Aortic valve regurgitation is not visualized. No aortic stenosis is present. Pulmonic Valve: The pulmonic valve was normal in structure. Pulmonic valve regurgitation is not visualized. No evidence of pulmonic stenosis. Aorta: The aortic root is  normal in size and structure. Venous: The inferior vena cava is normal in size with greater than 50% respiratory variability, suggesting right atrial pressure of 3 mmHg. IAS/Shunts: No atrial level shunt detected by color flow Doppler.  LEFT VENTRICLE PLAX 2D LVIDd:         5.30 cm      Diastology LVIDs:         3.20 cm      LV e' medial:    9.14 cm/s LV PW:         1.10 cm      LV E/e' medial:  10.5 LV IVS:        1.20 cm      LV e' lateral:   13.70 cm/s LVOT diam:     2.30 cm      LV E/e' lateral: 7.0 LV SV:         111 LV SV Index:   38 LVOT Area:     4.15 cm  LV Volumes (MOD) LV vol d, MOD A2C: 101.0 ml LV vol d, MOD A4C: 140.5 ml LV vol s, MOD A2C: 36.1 ml LV vol s, MOD A4C: 41.8 ml LV SV MOD A2C:     64.9 ml LV SV MOD A4C:     140.5 ml LV SV MOD BP:      84.4 ml RIGHT VENTRICLE RV S prime:     16.60 cm/s TAPSE (M-mode): 1.9 cm LEFT ATRIUM           Index       RIGHT ATRIUM           Index LA diam:      3.80 cm 1.32 cm/m  RA Area:     16.20 cm LA Vol (A2C): 17.5 ml 6.08 ml/m  RA Volume:   40.40 ml  14.05 ml/m LA Vol (A4C): 45.7 ml 15.89 ml/m  AORTIC VALVE LVOT Vmax:   147.00 cm/s LVOT Vmean:  97.100 cm/s LVOT VTI:    0.266 m  AORTA Ao Root diam: 3.30 cm Ao Asc diam:  3.60 cm MITRAL VALVE               TRICUSPID VALVE MV Area (PHT): 4.39 cm    TR Peak grad:   12.5 mmHg MV Decel Time: 173 msec    TR Vmax:        177.00 cm/s MV E velocity: 95.70 cm/s MV A velocity: 74.40 cm/s  SHUNTS MV E/A ratio:  1.29        Systemic VTI:  0.27 m                            Systemic Diam: 2.30 cm Chilton Si MD Electronically signed by Chilton Si MD Signature Date/Time: 06/11/2021/3:52:45  PM    Final     Cardiac Studies   Cath: Mid LAD lesion is 80% stenosed.   Prox LAD to Mid LAD lesion is 100% stenosed.   Dist RCA lesion is 20% stenosed.   Mid RCA lesion is 20% stenosed.   A stent was successfully placed.   Post intervention, there is a 0% residual stenosis.   Post intervention, there is a 0%  residual stenosis.   There is severe left ventricular systolic dysfunction.   Acute anterior wall myocardial infarction secondary to total LAD occlusion at the takeoff of the first diagonal vessel complicated by VF cardiac arrest in the emergency room with successful CPR, intubation,  and ROSC.   Normal large left circumflex coronary artery and mild luminal irregularity of the dominant RCA.   Severe acute LV dysfunction with EF estimated approximately 25% with severe hypokinesis to akinesis/dyskinesis of the anterolateral wall extending to the apex.  LVEDP 17 mmHg.   Successful PCI to the totally occluded LAD with ultimate insertion of a 3.5 x 38 mm Resolute frontier stent postdilated to stent taper from 3.8 proximally to 3.6 at the distal aspect of the stent.  There was mild thrombus following initial stent pression at the ostium of the diagonal vessel which improved and had TIMI-3 flow.   RECOMMENDATION: The patient will be admitted to to heart ICU.  Once nG-tube is placed he will be given oral Brilinta with ultimate discontinuance of cangrelor.  Continue DAPT for minimum of 12 months.  Will reinitiate heparin 10 hours post sheath removal in light of significant wall motion abnormality and thrombus at the ostium of the diagonal.  Plan 2D echo Doppler study in a.m.  Once extubated, initiate post MI ARB therapy as BP allows with possible need to transition to [facility] if LV function does not improve.  Pulmonary critical care will manage the patient's sedation and ventilation.  Initiate aggressive lipid-lowering therapy.  Smoking cessation is essential.  Patient Profile     45 y.o. male PAF, tobacco abuse and h/o hypokalemia for the evaluation of cardiac arrest in the setting of ACS.   Assessment & Plan   Principal Problem:   Acute ST elevation myocardial infarction (STEMI) due to occlusion of left anterior descending (LAD) coronary artery (HCC) Active Problems:   VF (ventricular fibrillation)  (HCC)   Cardiac arrest (HCC)   STEMI with VF arrest CAD with occluded LAD -continue oral amiodarone 200 mg daily until follow up as outpatient, likely will not need beyond then but to be determined by  - labs pending this am, will reevaluate plt count. - LVEF normal. -Remains intubated on fentanyl and propofol, bedside nurses attempting to wean. - will add metoprolol tartrate 25 mg BID - asa 81 mg daily and ticagrelor 90 mg BID, at least 12 mo - atorvastatin 80 mg daily - med mgmt of residual CAD   Mild hypokalemia  - labs pending this am.  Urinary retention - resolved after extubation.  If labs stable and cardiac rehab able to see, then stable for discharge later today.   For questions or updates, please contact [facility] Please consult [email or web address] for contact info under        Signed, Parke Poisson, MD  06/12/2021, 9:54 AM

## 2021-06-12 NOTE — Progress Notes (Signed)
Discharge instructions given to pt and pt's spouse at bedside. Medications reviewed and pharmacy confirmed. Brilinta card given to patient. Follow-up instructions and contact information reviewed. All Pt belongings given to Pt spouse at discharge. This nurse wheeled patient to exit placed patient in their vehicle. No events noted. Discharged at 1pm.

## 2021-06-12 NOTE — TOC Transition Note (Signed)
Transition of Care [facility]) - CM/SW Discharge Note   Patient Details  Name: Gary Sandoval MRN: [id number] Date of Birth: 1976-08-07  [facility] CM/SW Contact:  Pollie Friar, RN Phone Number: 06/12/2021, 12:57 PM   Clinical Narrative:    Patient is discharging home with self care. Pt is without insurance. CM met with the patient at the bedside. Bedside RN provided him with coupon for Brilinta. CM provided him the patient assistance application for Brilinta. Pt has transport home today.   Final next level of care: Home/Self Care Barriers to Discharge: Inadequate or no insurance, Barriers Unresolved (comment)   Patient Goals and CMS Choice        Discharge Placement                       Discharge Plan and Services                                     Social Determinants of Health (SDOH) Interventions     Readmission Risk Interventions No flowsheet data found.

## 2021-06-12 NOTE — Progress Notes (Signed)
NAME:  Gary Sandoval, MRN:  [id number], DOB:  Jun 05, 1976, LOS: 2 ADMISSION DATE:  06/10/2021, CONSULTATION DATE:  06/10/21 REFERRING MD:  Tresa Endo- Cards, CHIEF COMPLAINT:  Vfib arrest    History of Present Illness:   45 yo PMH tobacco abuse, afib (episodic), obesity and strong family history of CAD presented to ED via EMS with CC chest pain and SOB. Intermittent chest pain x 1week. Today fely SOB, nauseous, weak, clammy. Clinical suspicion by EMS for ACS -- given ASA ntg with sx improvement, and ECG with EMS with  aVR V1 ST elevation, hyperacute T waves V3-V6. Cards consulted and c/f LM/LAD ischemia.  Pt had recurrence of chest pain and nausea. Became unresponsive, Vfib noted on monitor. Cath lab activated. ACLS in ED with epi defib amio defib given with ROSC. Intubated during ACLS.  Taken to cath lab.   Admission labs are grossly unremarkable.   PCCM is consulted in this setting   Pertinent  Medical History   Afib  Hypokalemia  Tobacco abuse  Significant Hospital Events: Including procedures, antibiotic start and stop dates in addition to other pertinent events   STEMI, Vfib cardiac arrest. intubated in ED during Vfib arrest. Cath lab, 1x stent to LAD  Interim History / Subjective:  No overnight issues. Wife at bedside, patietn wants to go home.   Objective   Blood pressure 123/76, pulse 60, temperature 97.7 F (36.5 C), resp. rate 18, height 6\' 8"  (2.032 m), weight (!) 154.2 kg, SpO2 94 %.        Intake/Output Summary (Last 24 hours) at 06/12/2021 1114 Last data filed at 06/12/2021 0800 Gross per 24 hour  Intake 1805.51 ml  Output 900 ml  Net 905.51 ml   Filed Weights   06/10/21 1219 06/11/21 0508 06/12/21 0630  Weight: (!) 158.8 kg (!) 153.6 kg (!) 154.2 kg    Examination: General: awake alert, sitting in bed Neuro: normal speech, no focal deficits HENT: mmm Lungs: clear lungs, non labored Cardiovascular: RRR no mrg Abdomen: obese, soft   Labs personally reviewed   K 3.7 Cr 1.0 Na 135 [place] 167  Echo shows perserved EF, some LVH post cath. No wall motion abnormalities.   Resolved Hospital Problem list   Respiratory failure - resolved  Assessment & Plan:  Gary Sandoval is a 45 y.o. man with undiscovered PMH who presents with:  Witnessed in hospital Vfib arrest with ROSC STEMI  s/p cath with 1x stent to LAD CAD P -DAPT per cardiology - hemodynamically stable - statin - A1C pending   HTN P -PRN hydral, labetalol   Hx Afib  P - rate controlled   Tobacco use disorder P -needs cessation counseling  - he says he is done smoking as of this admission  Advised patient to follow up with PCP as an outpatient.  Ok with discharge from pulmonary perspective.   47, MD Pulmonary and Critical Care Medicine [facility]   CBC: Recent Labs  Lab 06/10/21 1218 06/10/21 1328 06/10/21 1621 06/11/21 0645 06/12/21 0946  WBC 11.1*  --   --  10.2 8.6  NEUTROABS  --   --   --   --  5.9  HGB 16.0 15.3 14.3 14.9 14.1  HCT 46.7 45.0 42.0 46.0 41.2  MCV 94.0  --   --  99.1 94.1  PLT 226  --   --  105* 167    Basic Metabolic Panel: Recent Labs  Lab 06/10/21 1218 06/10/21 1328  06/10/21 1621 06/10/21 1652 06/11/21 0645 06/12/21 0946  NA 136 137 138 135 138 135  K 3.6 3.3* 4.0 3.9 3.7 3.7  CL 102 103  --  105 102 104  CO2 23  --   --  23 26 24   GLUCOSE 123* 238*  --  135* 170* 125*  BUN 10 11  --  10 7 6   CREATININE 1.21 1.00  --  1.00 1.05 1.00  CALCIUM 8.9  --   --  8.2* 8.2* 8.5*  MG  --   --   --  2.4  --   --    GFR: Estimated Creatinine Clearance: 155.8 mL/min (by C-G formula based on SCr of 1 mg/dL). Recent Labs  Lab 06/10/21 1218 06/11/21 0645 06/12/21 0946  WBC 11.1* 10.2 8.6    Liver Function Tests: Recent Labs  Lab 06/10/21 1218 06/11/21 0645  AST 18 39  ALT 15 26  ALKPHOS 69 74  BILITOT 1.4* 0.7  PROT 6.9 6.0*  ALBUMIN 4.0 3.4*   No results for input(s): LIPASE, AMYLASE  in the last 168 hours. No results for input(s): AMMONIA in the last 168 hours.  ABG    Component Value Date/Time   PHART 7.367 06/10/2021 1621   PCO2ART 41.9 06/10/2021 1621   PO2ART 167 (H) 06/10/2021 1621   HCO3 24.3 06/10/2021 1621   TCO2 26 06/10/2021 1621   ACIDBASEDEF 1.0 06/10/2021 1621   O2SAT 99.0 06/10/2021 1621     Coagulation Profile: No results for input(s): INR, PROTIME in the last 168 hours.  Cardiac Enzymes: No results for input(s): CKTOTAL, CKMB, CKMBINDEX, TROPONINI in the last 168 hours.  HbA1C: Hgb A1c MFr Bld  Date/Time Value Ref Range Status  05/13/2010 08:32 AM  <5.7 % Final   5.4 (NOTE)                                                                       According to the ADA Clinical Practice Recommendations for 2011, when HbA1c is used as a screening test:   >=6.5%   Diagnostic of Diabetes Mellitus           (if abnormal result  is confirmed)  5.7-6.4%   Increased risk of developing Diabetes Mellitus  References:Diagnosis and Classification of Diabetes Mellitus,Diabetes Care,2011,34(Suppl 1):S62-S69 and Standards of Medical Care in         Diabetes - 2011,Diabetes Care,2011,34  (Suppl 1):S11-S61.

## 2021-06-12 NOTE — Discharge Summary (Signed)
Discharge Summary    Patient ID: Gary Sandoval MRN: [id number]; DOB: December 08, 1975  Admit date: 06/10/2021 Discharge date: 06/12/2021  PCP:  Lonie Peak, PA-C   [facility] Providers Cardiologist:  Nicki Guadalajara, MD   Discharge Diagnoses    Principal Problem:   Acute ST elevation myocardial infarction (STEMI) due to occlusion of left anterior descending (LAD) coronary artery [facility]) Active Problems:   TOBACCO ABUSE   VF (ventricular fibrillation) (HCC)   Cardiac arrest [facility])   Diagnostic Studies/Procedures    Left heart cath 06/10/21:   Mid LAD lesion is 80% stenosed.   Prox LAD to Mid LAD lesion is 100% stenosed.   Dist RCA lesion is 20% stenosed.   Mid RCA lesion is 20% stenosed.   A stent was successfully placed.   Post intervention, there is a 0% residual stenosis.   Post intervention, there is a 0% residual stenosis.   There is severe left ventricular systolic dysfunction.   Acute anterior wall myocardial infarction secondary to total LAD occlusion at the takeoff of the first diagonal vessel complicated by VF cardiac arrest in the emergency room with successful CPR, intubation,  and ROSC.   Normal large left circumflex coronary artery and mild luminal irregularity of the dominant RCA.   Severe acute LV dysfunction with EF estimated approximately 25% with severe hypokinesis to akinesis/dyskinesis of the anterolateral wall extending to the apex.  LVEDP 17 mmHg.   Successful PCI to the totally occluded LAD with ultimate insertion of a 3.5 x 38 mm Resolute frontier stent postdilated to stent taper from 3.8 proximally to 3.6 at the distal aspect of the stent.  There was mild thrombus following initial stent pression at the ostium of the diagonal vessel which improved and had TIMI-3 flow.   RECOMMENDATION: The patient will be admitted to to heart ICU.  Once nG-tube is placed he will be given oral Brilinta with ultimate discontinuance of cangrelor.  Continue DAPT for minimum  of 12 months.  Will reinitiate heparin 10 hours post sheath removal in light of significant wall motion abnormality and thrombus at the ostium of the diagonal.  Plan 2D echo Doppler study in a.m.  Once extubated, initiate post MI ARB therapy as BP allows with possible need to transition to [place] if LV function does not improve.  Pulmonary critical care will manage the patient's sedation and ventilation.  Initiate aggressive lipid-lowering therapy.  Smoking cessation is essential. _____________  Echo 06/11/21 1. Left ventricular ejection fraction, by estimation, is 60 to 65%. The  left ventricle has normal function. The left ventricle has no regional  wall motion abnormalities. There is mild concentric left ventricular  hypertrophy. Left ventricular diastolic  parameters were normal.   2. Right ventricular systolic function is normal. The right ventricular  size is normal. There is normal pulmonary artery systolic pressure.   3. The mitral valve is normal in structure. Trivial mitral valve  regurgitation. No evidence of mitral stenosis.   4. The aortic valve is tricuspid. Aortic valve regurgitation is not  visualized. No aortic stenosis is present.   5. The inferior vena cava is normal in size with greater than 50%  respiratory variability, suggesting right atrial pressure of 3 mmHg.     History of Present Illness     Gary Sandoval is a 45 y.o. male with PAF, tobacco abuse,and hx of hypokalemia presented to [facility] with chest pain.   [name] is a 45 year old male with past medical history of atrial fibrillation,  tobacco abuse and hypokalemia.  Patient had a episode of paroxysmal A. fib in July 2021 and was seen by Dr. Willa Rough prior to his retirement.  Patient self converted on rate control medication.  Echocardiogram at the time showed normal ejection fraction.  Atrial fibrillation was felt to be related to significant caffeine intake.  He was not placed on anticoagulation therapy due  to low CHA2DS2-VASc score.  Unfortunately, patient has been lost to follow-up since 2011.  He was seen by Dr. Leonette Most fields of vascular surgery in May 2019 for painful bilateral lower extremity varicose veins.  Compression therapy was recommended.   He presented to [facility] ED on 06/10/2021 with intermittent chest pain since last Saturday.  Initial EKG showed no obvious ST-T wave changes, however per ED report, EKG obtained by EMS showed ST depression in the inferolateral leads with relative elevation in the aVR and V1.  EKG has been reviewed by DOD Dr. Royann Shivers was also concerned for significant ischemia.  Cardiology service was emergently consulted.  Unfortunately, while in the emergency room, patient had worsening chest pain and the nausea.  He subsequently went into V. fib arrest and became unresponsive.  He underwent a CPR in the chest compression with successful defibrillation.  He received 1 epi, amnio bolus and 2 defibrillations prior to achieving ROSC.  Cath Lab was emergently activated, patient moved to Cath Lab for urgent cardiac catheterization.  Prior to coming up, patient was intubated in the ER.  Majority of the history has been obtained from chart review, as the patient is currently intubated and nonresponsive.  Hospital Course     Consultants: none  Anterior STEMI Vfib arrest Initial EKG by EMS with ST elevation in AVR and V1 with corresponding ST depression. He received epi, amio, and difib.  After ROSC in the ER, he was intubated. He was taken emergently to the cath lab found to have 100% followed by 80% occlusion of the LAD treated with DES. Pt was transferred to ICU with placement of NG tube for DAPT administration. Post-op course complicated by agitation requiring soft wrist restraints and urinary retention requiring in and out cath. PLTs dropped from 226 to 105 and heparin was held. PLT today back up to 167.  Mild bradycardia initially precluded addition of BB. Was able to add  lopressor today.   Heart cath initially estimated LVEF at 25%. However, follow up echo the next day revealed normalized EF, normal PA pressure, and no significant valvular disease. He was extubated and ambulated with issues. K was replaced today.  Amiodarone will be continued until follow up. Continue ASA, brilinta, and amiodarone.    Hyperlipidemia with LDL goal < 70 06/11/2021: Triglycerides 164 Full lipid panel pending Continue lipitor 80 mg. Repeat lipids in 6 weeks.    Bradycardia Resolved. Continue low dose BB.    Urinary retention Resolved.   PAF No Afib on telemetry. Will hold OAC for now.    Pt seen and examined by Dr. Jacques Navy and deemed stable for discharge. Follow up will be arranged with our office.    Did the patient have an acute coronary syndrome (MI, NSTEMI, STEMI, etc) this admission?:  Yes                               AHA/ACC Clinical Performance & Quality Measures: Aspirin prescribed? - Yes ADP Receptor Inhibitor (Plavix/Clopidogrel, Brilinta/Ticagrelor or Effient/Prasugrel) prescribed (includes medically managed patients)? - Yes Beta  Blocker prescribed? - Yes High Intensity Statin (Lipitor 40-80mg  or Crestor 20-40mg ) prescribed? - Yes EF assessed during THIS hospitalization? - Yes For EF <40%, was ACEI/ARB prescribed? - Not Applicable (EF >/= 40%) For EF <40%, Aldosterone Antagonist (Spironolactone or Eplerenone) prescribed? - Not Applicable (EF >/= 40%) Cardiac Rehab Phase II ordered (including medically managed patients)? - Yes      _____________  Discharge Vitals Blood pressure 123/76, pulse 60, temperature 97.7 F (36.5 C), resp. rate 18, height  (2.032 m), weight (!) 154.2 kg, SpO2 94 %.  Filed Weights   06/10/21 1219 06/11/21 0508 06/12/21 0630  Weight: (!) 158.8 kg (!) 153.6 kg (!) 154.2 kg    Labs & Radiologic Studies    CBC Recent Labs    06/11/21 0645 06/12/21 0946  WBC 10.2 8.6  NEUTROABS  --  5.9  HGB 14.9 14.1  HCT 46.0  41.2  MCV 99.1 94.1  PLT 105* 167   Basic Metabolic Panel Recent Labs    57/84/69 1652 06/11/21 0645 06/12/21 0946  NA 135 138 135  K 3.9 3.7 3.7  CL 105 102 104  CO2 GLUCOSE 135* 170* 125*  BUN CREATININE 1.00 1.05 1.00  CALCIUM 8.2* 8.2* 8.5*  MG 2.4  --   --    Liver Function Tests Recent Labs    06/10/21 1218 06/11/21 0645  AST 18 39  ALT 15 26  ALKPHOS 69 74  BILITOT 1.4* 0.7  PROT 6.9 6.0*  ALBUMIN 4.0 3.4*   No results for input(s): LIPASE, AMYLASE in the last 72 hours. High Sensitivity Troponin:   Recent Labs  Lab 06/10/21 1218 06/10/21 1652 06/11/21 0645  TROPONINIHS 17 1,491* 2,477*    BNP Invalid input(s): POCBNP D-Dimer No results for input(s): DDIMER in the last 72 hours. Hemoglobin A1C No results for input(s): HGBA1C in the last 72 hours. Fasting Lipid Panel Recent Labs    06/11/21 0645  TRIG 164*   Thyroid Function Tests No results for input(s): TSH, T4TOTAL, T3FREE, THYROIDAB in the last 72 hours.  Invalid input(s): FREET3 _____________  Varney Biles Abd 1 View  Result Date: 06/10/2021 CLINICAL DATA:  NG tube placement EXAM: ABDOMEN - 1 VIEW COMPARISON:  None. FINDINGS: NG tube is in place with the tip in the descending duodenum. Nonobstructive bowel gas pattern. Prior cholecystectomy. IMPRESSION: NG tube tip in the descending duodenum. Electronically Signed   By: Charlett Nose M.D.   On: 06/10/2021 16:39   CARDIAC CATHETERIZATION  Result Date: 06/10/2021 Formatting of this result is different from the original.   Mid LAD lesion is 80% stenosed.   Prox LAD to Mid LAD lesion is 100% stenosed.   Dist RCA lesion is 20% stenosed.   Mid RCA lesion is 20% stenosed.   A stent was successfully placed.   Post intervention, there is a 0% residual stenosis.   Post intervention, there is a 0% residual stenosis.   There is severe left ventricular systolic dysfunction. Acute anterior wall myocardial infarction secondary to total LAD occlusion  at the takeoff of the first diagonal vessel complicated by VF cardiac arrest in the emergency room with successful CPR, intubation,  and ROSC. Normal large left circumflex coronary artery and mild luminal irregularity of the dominant RCA. Severe acute LV dysfunction with EF estimated approximately 25% with severe hypokinesis to akinesis/dyskinesis of the anterolateral wall extending to the apex.  LVEDP 17 mmHg. Successful PCI to the totally occluded LAD with  ultimate insertion of a 3.5 x 38 mm Resolute frontier stent postdilated to stent taper from 3.8 proximally to 3.6 at the distal aspect of the stent.  There was mild thrombus following initial stent pression at the ostium of the diagonal vessel which improved and had TIMI-3 flow. RECOMMENDATION: The patient will be admitted to to heart ICU.  Once nG-tube is placed he will be given oral Brilinta with ultimate discontinuance of cangrelor.  Continue DAPT for minimum of 12 months.  Will reinitiate heparin 10 hours post sheath removal in light of significant wall motion abnormality and thrombus at the ostium of the diagonal.  Plan 2D echo Doppler study in a.m.  Once extubated, initiate post MI ARB therapy as BP allows with possible need to transition to [facility] if LV function does not improve.  Pulmonary critical care will manage the patient's sedation and ventilation.  Initiate aggressive lipid-lowering therapy.  Smoking cessation is essential.   DG CHEST PORT 1 VIEW  Result Date: 06/10/2021 CLINICAL DATA:  Chest pain, shortness of breath EXAM: PORTABLE CHEST 1 VIEW COMPARISON:  06/10/2021 FINDINGS: NG tube coils in the stomach. Heart and mediastinal contours are within normal limits. No focal opacities or effusions. No acute bony abnormality. IMPRESSION: No active disease. Electronically Signed   By: Charlett Nose M.D.   On: 06/10/2021 16:37   DG Chest Port 1 View  Result Date: 06/10/2021 CLINICAL DATA:  Chest pain and shortness of breath EXAM: PORTABLE  CHEST 1 VIEW COMPARISON:  05/13/2010 FINDINGS: Two AP radiographs of the chest were obtained. There are increased interstitial markings within the semi upright view which do not persist on the upright view. Minimal linear bibasilar markings persist, likely atelectasis. Heart size is upper limits of normal, unchanged. No pleural effusion or pneumothorax. IMPRESSION: Minimal linear bibasilar atelectasis.  Lungs are otherwise clear. Electronically Signed   By: Duanne Guess D.O.   On: 06/10/2021 12:58   ECHOCARDIOGRAM COMPLETE  Result Date: 06/11/2021    ECHOCARDIOGRAM REPORT   Patient Name:   Gary Sandoval Date of Exam: 06/11/2021 Medical Rec #:  [id number]       Height:       80.0 in Accession #:    [id number]      Weight:       338.6 lb Date of Birth:  20-Aug-1976       BSA:          2.876 m Patient Age:    44 years        BP:           121/63 mmHg Patient Gender: M               HR:           86 bpm. Exam Location:  Inpatient Procedure: 2D Echo, Cardiac Doppler, Color Doppler and Intracardiac            Opacification Agent Indications:    122-I22.9 Subsequent ST elevation (STEM) and non-ST elevation                 (NSTEMI) myocardial infarction  History:        Patient has no prior history of Echocardiogram examinations.                 Acute MI, Abnormal ECG, Arrythmias:Cardiac Arrest and                 Ventricular Fibrillation, Signs/Symptoms:Chest Pain; Risk  Factors:Current Smoker.  Sonographer:    Sheralyn Boatman RDCS Referring Phys: [phone] THOMAS A KELLY  Sonographer Comments: Technically difficult study due to poor echo windows and patient is morbidly obese. Image acquisition challenging due to patient body habitus. Patient is post cath. IMPRESSIONS  1. Left ventricular ejection fraction, by estimation, is 60 to 65%. The left ventricle has normal function. The left ventricle has no regional wall motion abnormalities. There is mild concentric left ventricular hypertrophy. Left ventricular  diastolic parameters were normal.  2. Right ventricular systolic function is normal. The right ventricular size is normal. There is normal pulmonary artery systolic pressure.  3. The mitral valve is normal in structure. Trivial mitral valve regurgitation. No evidence of mitral stenosis.  4. The aortic valve is tricuspid. Aortic valve regurgitation is not visualized. No aortic stenosis is present.  5. The inferior vena cava is normal in size with greater than 50% respiratory variability, suggesting right atrial pressure of 3 mmHg. FINDINGS  Left Ventricle: Left ventricular ejection fraction, by estimation, is 60 to 65%. The left ventricle has normal function. The left ventricle has no regional wall motion abnormalities. Definity contrast agent was given IV to delineate the left ventricular  endocardial borders. The left ventricular internal cavity size was normal in size. There is mild concentric left ventricular hypertrophy. Left ventricular diastolic parameters were normal. Indeterminate filling pressures. Right Ventricle: The right ventricular size is normal. No increase in right ventricular wall thickness. Right ventricular systolic function is normal. There is normal pulmonary artery systolic pressure. The tricuspid regurgitant velocity is 1.77 m/s, and  with an assumed right atrial pressure of 3 mmHg, the estimated right ventricular systolic pressure is 15.5 mmHg. Left Atrium: Left atrial size was normal in size. Right Atrium: Right atrial size was normal in size. Pericardium: There is no evidence of pericardial effusion. Mitral Valve: The mitral valve is normal in structure. Trivial mitral valve regurgitation. No evidence of mitral valve stenosis. Tricuspid Valve: The tricuspid valve is normal in structure. Tricuspid valve regurgitation is trivial. No evidence of tricuspid stenosis. Aortic Valve: The aortic valve is tricuspid. Aortic valve regurgitation is not visualized. No aortic stenosis is present.  Pulmonic Valve: The pulmonic valve was normal in structure. Pulmonic valve regurgitation is not visualized. No evidence of pulmonic stenosis. Aorta: The aortic root is normal in size and structure. Venous: The inferior vena cava is normal in size with greater than 50% respiratory variability, suggesting right atrial pressure of 3 mmHg. IAS/Shunts: No atrial level shunt detected by color flow Doppler.  LEFT VENTRICLE PLAX 2D LVIDd:         5.30 cm      Diastology LVIDs:         3.20 cm      LV e' medial:    9.14 cm/s LV PW:         1.10 cm      LV E/e' medial:  10.5 LV IVS:        1.20 cm      LV e' lateral:   13.70 cm/s LVOT diam:     2.30 cm      LV E/e' lateral: 7.0 LV SV:         111 LV SV Index:   38 LVOT Area:     4.15 cm  LV Volumes (MOD) LV vol d, MOD A2C: 101.0 ml LV vol d, MOD A4C: 140.5 ml LV vol s, MOD A2C: 36.1 ml LV vol s, MOD A4C: 41.8 ml  LV SV MOD A2C:     64.9 ml LV SV MOD A4C:     140.5 ml LV SV MOD BP:      84.4 ml RIGHT VENTRICLE RV S prime:     16.60 cm/s TAPSE (M-mode): 1.9 cm LEFT ATRIUM           Index       RIGHT ATRIUM           Index LA diam:      3.80 cm 1.32 cm/m  RA Area:     16.20 cm LA Vol (A2C): 17.5 ml 6.08 ml/m  RA Volume:   40.40 ml  14.05 ml/m LA Vol (A4C): 45.7 ml 15.89 ml/m  AORTIC VALVE LVOT Vmax:   147.00 cm/s LVOT Vmean:  97.100 cm/s LVOT VTI:    0.266 m  AORTA Ao Root diam: 3.30 cm Ao Asc diam:  3.60 cm MITRAL VALVE               TRICUSPID VALVE MV Area (PHT): 4.39 cm    TR Peak grad:   12.5 mmHg MV Decel Time: 173 msec    TR Vmax:        177.00 cm/s MV E velocity: 95.70 cm/s MV A velocity: 74.40 cm/s  SHUNTS MV E/A ratio:  1.29        Systemic VTI:  0.27 m                            Systemic Diam: 2.30 cm Chilton Si MD Electronically signed by Chilton Si MD Signature Date/Time: 06/11/2021/3:52:45 PM    Final    Disposition   Pt is being discharged home today in good condition.  Follow-up Plans & Appointments     Follow-up Information     Lennette Bihari, MD Follow up in 1 week(s).   Specialty: Cardiology Why: Office will call on Monday, or you may call. Contact information: [address] [place] [address] [phone]                Discharge Instructions     Amb Referral to Cardiac Rehabilitation   Complete by: As directed    Diagnosis:  Coronary Stents STEMI PTCA     After initial evaluation and assessments completed: Virtual Based Care may be provided alone or in conjunction with Phase 2 Cardiac Rehab based on patient barriers.: Yes   Amb Referral to Cardiac Rehabilitation   Complete by: As directed    Diagnosis:  Coronary Stents STEMI     After initial evaluation and assessments completed: Virtual Based Care may be provided alone or in conjunction with Phase 2 Cardiac Rehab based on patient barriers.: Yes   Diet - low sodium heart healthy   Complete by: As directed    Discharge instructions   Complete by: As directed    No driving for 1 week. No lifting over 5 lbs for 1 week. No sexual activity for 1 week. You may return to work after follow up in the office. Keep procedure site clean & dry. If you notice increased pain, swelling, bleeding or pus, call/return!  You may shower, but no soaking baths/hot tubs/pools for 1 week.   Office should call with appt, or you can all the number on your AVS on Monday.   Increase activity slowly   Complete by: As directed        Discharge Medications   Allergies as  of 06/12/2021       Reactions   Codeine Other (See Comments)   Makes him act wierd        Medication List     STOP taking these medications    ibuprofen 200 MG tablet Commonly known as: ADVIL       TAKE these medications    amiodarone 200 MG tablet Commonly known as: PACERONE Take 1 tablet (200 mg total) by mouth daily. Start taking on: June 13, 2021   aspirin EC 81 MG tablet Take 1 tablet (81 mg total) by mouth daily. Swallow whole.   atorvastatin 80 MG  tablet Commonly known as: LIPITOR Take 1 tablet (80 mg total) by mouth daily. Start taking on: June 13, 2021   ECHINACEA PO Take 2 capsules by mouth 2 (two) times daily.   metoprolol tartrate 25 MG tablet Commonly known as: LOPRESSOR Take 0.5 tablets (12.5 mg total) by mouth 2 (two) times daily.   nitroGLYCERIN 0.4 MG SL tablet Commonly known as: Nitrostat Place 1 tablet (0.4 mg total) under the tongue every 5 (five) minutes as needed for chest pain.   OVER THE COUNTER MEDICATION Take 4 tablets by mouth See admin instructions. Juice plus: take 2 vegie tablets and 2 fruit tablets by mouth twice daily   pantoprazole 40 MG tablet Commonly known as: PROTONIX Take 1 tablet (40 mg total) by mouth daily. Start taking on: June 13, 2021   POTASSIUM PO Take 1 tablet by mouth 2 (two) times daily.   ticagrelor 90 MG Tabs tablet Commonly known as: BRILINTA Take 1 tablet (90 mg total) by mouth 2 (two) times daily.           Outstanding Labs/Studies   BMP Lipids   Duration of Discharge Encounter   Greater than 30 minutes including physician time.  Signed, Roe Rutherfordngela Nicole Janey Petron, PA 06/12/2021, 11:38 AM

## 2021-06-13 ENCOUNTER — Encounter (HOSPITAL_COMMUNITY): Payer: Self-pay | Admitting: Cardiovascular Disease

## 2021-06-13 ENCOUNTER — Telehealth (HOSPITAL_COMMUNITY): Payer: Self-pay | Admitting: *Deleted

## 2021-06-13 DIAGNOSIS — I2102 ST elevation (STEMI) myocardial infarction involving left anterior descending coronary artery: Secondary | ICD-10-CM

## 2021-06-13 MED ORDER — IOHEXOL 350 MG/ML SOLN
INTRAVENOUS | Status: DC | PRN
  Administered 2021-06-10: 180 mL via INTRA_ARTERIAL

## 2021-06-13 NOTE — Telephone Encounter (Signed)
Called pt to discuss education since d/c'd on Sunday. Pt and wife on phone, wife engaged in conversation. Pt sts he is not sleeping at all and wants a cigarette but is determined to not smoke. Discussed MI, stent, restrictions, smoking cessation, walking/exercise, NTG and CRPII. Voiced understanding. They are in the process of getting Brilinta as their insurance company is not covering it. The pharmacy gave them enough to get by. Encouraged wife to call [facility] and also discuss with MD office to check on samples. Wife also to talk with MD office regarding lack of sleep. He sts his hand is swollen from an IV and his shoulder is painful with movement. Will send referral to [facility]. Will send materials to pt in mail. [phone] Ethelda Chick CES, ACSM 3:12 PM 06/13/2021

## 2021-06-14 ENCOUNTER — Telehealth: Payer: Self-pay | Admitting: Cardiovascular Disease

## 2021-06-14 NOTE — Telephone Encounter (Signed)
Pt's wife is calling in with concerns her husband is not sleeping welll since he has been home

## 2021-06-14 NOTE — Telephone Encounter (Signed)
Spoke to patient's wife advised to see PCP about trouble sleeping.Gillian Shields NP advised cardiac meds not the cause.

## 2021-06-14 NOTE — Telephone Encounter (Signed)
Spoke to patient's wife.She stated husband has not been sleeping well since he was discharged from hospital.Stated he stopped smoking.She wanted to know if any of his medications would be causing.Advised medications should not be causing and he needs to continue as prescribed.Advised he has been through a lot and that can cause.Advised to keep post hospital appointment with Gillian Shields NP 9/1 at 11:30 am at [facility] location.Message sent to Gillian Shields NP to make her aware.

## 2021-06-14 NOTE — Telephone Encounter (Signed)
Agree with recommendations as provided.  Recommend follow up with PCP regarding sleep. Not related to cardiac medications.   Alver Sorrow, NP

## 2021-06-20 ENCOUNTER — Telehealth (HOSPITAL_COMMUNITY): Payer: Self-pay

## 2021-06-20 ENCOUNTER — Telehealth: Payer: Self-pay | Admitting: Cardiovascular Disease

## 2021-06-20 NOTE — Telephone Encounter (Signed)
Per phase I cardiac rehab, fax cardiac rehab referral to [place] [facility]. °

## 2021-06-20 NOTE — Telephone Encounter (Signed)
Patient's wife calling. She states she received a call from someone stating the office wanted the patient to be rescheduled from 9/10 to 8/23 at 1:30 pm with Dr. Tresa Endo.

## 2021-06-20 NOTE — Telephone Encounter (Signed)
Left message for patients wife, aware no mention in the chart that anyone called and dr Tresa Endo does not have any openings on 07/05/21. She will call back with questions.

## 2021-06-21 ENCOUNTER — Telehealth: Payer: Self-pay

## 2021-06-21 NOTE — Telephone Encounter (Addendum)
Called patient, let pt know Dr. Tresa Endo agreed to seeing patient 07/05/2021 at 1:00pm. Pt scheduled.   Pt reports recently he has had occasional shortness of breath he believes is related to his Brilinta. Pt reports "if I get real relaxed", he sometimes has to focus more on breathing. Pt denies chest pain, swelling, or weight gain. Pt understands he should not abruptly discontinue Brilinta.Advised pt his concerns would be sent to pharmacy and Dr. Tresa Endo to review, advised pt if he experiences worsening shortness of breath, chest pain, dizziness, or any other concerning symptoms he should call 911 or be taken to the emergency room. Pt verbalized understanding.

## 2021-06-22 ENCOUNTER — Ambulatory Visit: Payer: Self-pay | Admitting: Cardiology

## 2021-06-22 NOTE — Telephone Encounter (Signed)
RN consulted with DOD Dr. Bjorn Pippin, no acute recommendations at this time. This RN to forward message to Dr. Tresa Endo for review.

## 2021-06-24 NOTE — Telephone Encounter (Signed)
Called patient back, Raynelle Fanning, LPN reviewed pt's message with Dr. Tresa Endo, no changes recommended by Dr. Tresa Endo at this time, but advised that having some caffeine with medication can sometimes help shortness of breath. Pt reports the shortness of breath has improved from the last time he called. Nurse advised pt to keep his August 23 appointment and to call 911 or be taken to the emergency department if he has chest pain, worsening shortness of breath, dizziness or any other concerning symptoms. Pt verbalized understanding. All questions/concerns addressed at this time.

## 2021-07-05 ENCOUNTER — Other Ambulatory Visit: Payer: Self-pay

## 2021-07-05 ENCOUNTER — Ambulatory Visit (INDEPENDENT_AMBULATORY_CARE_PROVIDER_SITE_OTHER): Payer: Medicaid Other | Admitting: Cardiovascular Disease

## 2021-07-05 VITALS — BP 120/68 | HR 66 | Ht >= 80 in | Wt 341.6 lb

## 2021-07-05 DIAGNOSIS — F17200 Nicotine dependence, unspecified, uncomplicated: Secondary | ICD-10-CM

## 2021-07-05 DIAGNOSIS — I2102 ST elevation (STEMI) myocardial infarction involving left anterior descending coronary artery: Secondary | ICD-10-CM | POA: Diagnosis not present

## 2021-07-05 DIAGNOSIS — I469 Cardiac arrest, cause unspecified: Secondary | ICD-10-CM | POA: Diagnosis not present

## 2021-07-05 DIAGNOSIS — E785 Hyperlipidemia, unspecified: Secondary | ICD-10-CM

## 2021-07-05 DIAGNOSIS — Z6837 Body mass index (BMI) 37.0-37.9, adult: Secondary | ICD-10-CM

## 2021-07-05 NOTE — Patient Instructions (Signed)
Medication Instructions:  No Changes In Medications at this time.  *If you need a refill on your cardiac medications before your next appointment, please call your pharmacy*  Lab Work: CMET, CBC, TSH, LIPIDS- PLEASE HAVE THESE DRAWN 3 DAYS PRIOR TO NEXT APPOINTMENT WITH Dr. Tresa Endo. YOU WILL NEED TO FAST PRIOR.  If you have labs (blood work) drawn today and your tests are completely normal, you will receive your results only by: MyChart Message (if you have MyChart) OR A paper copy in the mail If you have any lab test that is abnormal or we need to change your treatment, we will call you to review the results.  Follow-Up: At [facility], you and your health needs are our priority.  As part of our continuing mission to provide you with exceptional heart care, we have created designated Provider Care Teams.  These Care Teams include your primary Cardiologist (physician) and Advanced Practice Providers (APPs -  Physician Assistants and Nurse Practitioners) who all work together to provide you with the care you need, when you need it.  Your next appointment:   2 month(s)  The format for your next appointment:   In Person  Provider:   Nicki Guadalajara, MD  Other Instructions REFERRAL TO CARDIAC REHAB. SOMEONE WILL REACH OUT TO YOU REGARDING THIS.

## 2021-07-05 NOTE — Progress Notes (Signed)
Cardiology Office Note    Date:  07/07/2021   ID:  Daelon, Dunivan 01-20-1976, MRN [id number]  PCP:  Lonie Peak, PA-C  Cardiologist:  Nicki Guadalajara, MD   Initial office following STEMI/VF cardiac arest   History of Present Illness:  KEYVON HERTER is a 45 y.o. male who presents for his initial office evaluation following his acute anterior wall myocardial infarction secondary to total LAD occlusion complicated by VF cardiac arrest in the emergency room with successful CPR, intubation and ROSC.  Mr. Eley has a prior history of paroxysmal atrial fibrillation in July 2011.  He self converted to sinus rhythm on rate control medication.  Echocardiogram at the time showed normal LV function.  Atrial fibrillation was felt to be related to significant caffeine intake and he was not placed on anticoagulation therapy due to low CHADS2vasc score.  In May 2019 he was seen by Dr. Darrick Penna of vascular surgery for painful bilateral lower extremity varicose veins for which compression therapy was recommended.  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  Late July the patient began to notice episodes of increasing shortness of breath.  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  He was brought by EMS to the emergency room and his ECG showed early ST elevation in aVR in lead V1.  In the ER he developed a ventricular fibrillation cardiac arrest requiring CPR, intubation, initiation of amiodarone therapy and had return of ROSC.  He was taken emergently to the cardiac catheterization laboratory where I performed emergent cardiac catheterization.  He was found to have total LAD occlusion at the takeoff of the first diagonal vessel as well as a normal left circumflex coronary artery and mild luminal irregularity of the dominant RCA.  He had severe acute LV dysfunction with EF estimated approximately 25% with severe hypokinesis to  akinesis/dyskinesis of the anterolateral wall extending to the apex.  LVEDP was 17 mm.  He underwent successful PCI to the totally occluded LAD with ultimate insertion of a 3.5 x 38 mm Medtronic Onyx frontier stent postdilated to stent taper from 3.8-3.6.  There was mild thrombus following initial stent insertion at the ostium of the diagonal vessel which improved and had TIMI-3 flow.  The patient did remarkably well and was ultimately extubated.  He was discharged 2 days later on June 12, 2021.  On June 11, 2021 the subsequent echo Doppler study showed normalization of LV function with EF 60 to 65%.  Since discharge, patient has done well.  He has been on a regimen consisting of aspirin/Brilinta for DAPT, metoprolol tartrate 12.5 mg twice a day, amiodarone 200 mg daily, and atorvastatin 80 mg.  He denies recurrent chest pain.  He has reduced his 3 pack/day tobacco habit down to approximately 8 cigarettes/day with plans for complete discontinuance.  He had started smoking at age 89.  He is unaware of palpitations.  He has not yet returned to work.  He presents for his initial office evaluation following his cardiac arrest.   Past Medical History:  Diagnosis Date   A-fib [facility])    Atrial fibrillation [facility])     Past Surgical History:  Procedure Laterality Date   CHOLECYSTECTOMY     CORONARY/GRAFT ACUTE MI REVASCULARIZATION N/A 06/10/2021   Procedure: Coronary/Graft Acute MI Revascularization;  Surgeon: Lennette Bihari, MD;  Location: [facility] INVASIVE CV LAB;  Service: Cardiovascular;  Laterality: N/A;  LEFT HEART CATH AND CORONARY ANGIOGRAPHY N/A 06/10/2021   Procedure: LEFT HEART CATH AND CORONARY ANGIOGRAPHY;  Surgeon: Troy Sine, MD;  Location: [place] CV LAB;  Service: Cardiovascular;  Laterality: N/A;    Current Medications: Outpatient Medications Prior to Visit  Medication Sig Dispense Refill   amiodarone (PACERONE) 200 MG tablet Take 1 tablet (200 mg total) by mouth daily. 90 tablet 3    aspirin EC 81 MG tablet Take 1 tablet (81 mg total) by mouth daily. Swallow whole. 150 tablet 2   atorvastatin (LIPITOR) 80 MG tablet Take 1 tablet (80 mg total) by mouth daily. 90 tablet 3   ECHINACEA PO Take 2 capsules by mouth 2 (two) times daily.     metoprolol tartrate (LOPRESSOR) 25 MG tablet Take 0.5 tablets (12.5 mg total) by mouth 2 (two) times daily. 180 tablet 3   nitroGLYCERIN (NITROSTAT) 0.4 MG SL tablet Place 1 tablet (0.4 mg total) under the tongue every 5 (five) minutes as needed for chest pain. 25 tablet 3   OVER THE COUNTER MEDICATION Take 4 tablets by mouth See admin instructions. Juice plus: take 2 vegie tablets and 2 fruit tablets by mouth twice daily     pantoprazole (PROTONIX) 40 MG tablet Take 1 tablet (40 mg total) by mouth daily. 90 tablet 3   POTASSIUM PO Take 1 tablet by mouth 2 (two) times daily.     ticagrelor (BRILINTA) 90 MG TABS tablet Take 1 tablet (90 mg total) by mouth 2 (two) times daily. 180 tablet 3   No facility-administered medications prior to visit.     Allergies:   Codeine   Social History   Socioeconomic History   Marital status: Married    Spouse name: Not on file   Number of children: Not on file   Years of education: Not on file   Highest education level: Not on file  Occupational History   Not on file  Tobacco Use   Smoking status: Every Day    Packs/day: 1.00    Types: Cigarettes   Smokeless tobacco: Never  Vaping Use   Vaping Use: Never used  Substance and Sexual Activity   Alcohol use: No   Drug use: No   Sexual activity: Not on file  Other Topics Concern   Not on file  Social History Narrative   Not on file   Social Determinants of Health   Financial Resource Strain: Not on file  Food Insecurity: Not on file  Transportation Needs: Not on file  Physical Activity: Not on file  Stress: Not on file  Social Connections: Not on file    Socially he is married.  He started smoking at age 52 and had smoked up to 3  packs/day.  He works as a Engineer, building services which requires heavy lifting, pushing and pulling.  He has a history of obesity.  Family History: No family history on file  ROS General: Negative; No fevers, chills, or night sweats; obesity HEENT: Negative; No changes in vision or hearing, sinus congestion, difficulty swallowing Pulmonary: Negative; No cough, wheezing, shortness of breath, hemoptysis Cardiovascular: See HPI, history of varicose veins GI: Negative; No nausea, vomiting, diarrhea, or abdominal pain GU: History of anorectal abscess Musculoskeletal: Negative; no myalgias, joint pain, or weakness Hematologic/Oncology: Negative; no easy bruising, bleeding Endocrine: Negative; no heat/cold intolerance; no diabetes Neuro: Negative; no changes in balance, headaches Skin: Negative; No rashes or skin lesions Psychiatric: Negative; No behavioral problems, depression Sleep: Negative; No snoring, daytime sleepiness,  hypersomnolence, bruxism, restless legs, hypnogognic hallucinations, no cataplexy Other comprehensive 14 point system review is negative.   PHYSICAL EXAM:   VS:  BP 120/68 (BP Location: Right Arm, Patient Position: Sitting, Cuff Size: Large)   Pulse 66   Ht $R'6\' 8"'hO$  (2.032 m)   Wt (!) 341 lb 9.6 oz (154.9 kg)   SpO2 96%   BMI 37.53 kg/m     Repeat blood pressure by me 122/70  Wt Readings from Last 3 Encounters:  07/05/21 (!) 341 lb 9.6 oz (154.9 kg)  06/12/21 (!) 339 lb 15.2 oz (154.2 kg)  04/11/18 (!) 331 lb (150.1 kg)    General: Alert, oriented, no distress.  Very tall 6 foot 8, moderate obesity Skin: normal turgor, no rashes, warm and dry HEENT: Normocephalic, atraumatic. Pupils equal round and reactive to light; sclera anicteric; extraocular muscles intact;  Nose without nasal septal hypertrophy Mouth/Parynx benign; Mallinpatti scale 3 Neck: Thick neck; no JVD, no carotid bruits; normal carotid upstroke Lungs: clear to ausculatation and percussion; no wheezing or  rales Chest wall: without tenderness to palpitation Heart: PMI not displaced, RRR, s1 s2 normal, 1/6 systolic murmur, no diastolic murmur, no rubs, gallops, thrills, or heaves Abdomen: soft, nontender; no hepatosplenomehaly, BS+; abdominal aorta nontender and not dilated by palpation. Back: no CVA tenderness Pulses 2+; right groin catheterization site stable Musculoskeletal: full range of motion, normal strength, no joint deformities Extremities: no clubbing cyanosis or edema, Homan's sign negative  Neurologic: grossly nonfocal; Cranial nerves grossly wnl Psychologic: Normal mood and affect   Studies/Labs Reviewed:   EKG:  EKG is ordered today.  ECG (independently read by me):  NSR  at 61, nonspecific T wave abnormality; early transition; QTc 446 msec  Recent Labs: BMP Latest Ref Rng & Units 06/12/2021 06/11/2021 06/10/2021  Glucose 70 - 99 mg/dL 125(H) 170(H) 135(H)  BUN 6 - 20 mg/dL $Remove'6 7 10  'JulyXDE$ Creatinine 0.61 - 1.24 mg/dL 1.00 1.05 1.00  Sodium 135 - 145 mmol/L 135 138 135  Potassium 3.5 - 5.1 mmol/L 3.7 3.7 3.9  Chloride 98 - 111 mmol/L 104 102 105  CO2 22 - 32 mmol/L $RemoveB'24 26 23  'MUijJRDN$ Calcium 8.9 - 10.3 mg/dL 8.5(L) 8.2(L) 8.2(L)     Hepatic Function Latest Ref Rng & Units 06/11/2021 06/10/2021 05/13/2010  Total Protein 6.5 - 8.1 g/dL 6.0(L) 6.9 6.1  Albumin 3.5 - 5.0 g/dL 3.4(L) 4.0 3.5  AST 15 - 41 U/L 39 18 17  ALT 0 - 44 U/L $Remo'26 15 12  'ZjpuX$ Alk Phosphatase 38 - 126 U/L 74 69 66  Total Bilirubin 0.3 - 1.2 mg/dL 0.7 1.4(H) 0.7  Bilirubin, Direct 0.0 - 0.2 mg/dL 0.1 - -    CBC Latest Ref Rng & Units 06/12/2021 06/11/2021 06/10/2021  WBC 4.0 - 10.5 K/uL 8.6 10.2 -  Hemoglobin 13.0 - 17.0 g/dL 14.1 14.9 14.3  Hematocrit 39.0 - 52.0 % 41.2 46.0 42.0  Platelets 150 - 400 K/uL 167 105(L) -   Lab Results  Component Value Date   MCV 94.1 06/12/2021   MCV 99.1 06/11/2021   MCV 94.0 06/10/2021    Lab Results  Component Value Date   HGBA1C  05/13/2010    5.4 (NOTE)  According to the ADA Clinical Practice Recommendations for 2011, when HbA1c is used as a screening test:   >=6.5%   Diagnostic of Diabetes Mellitus           (if abnormal result  is confirmed)  5.7-6.4%   Increased risk of developing Diabetes Mellitus  References:Diagnosis and Classification of Diabetes Mellitus,Diabetes XIPJ,8250,53(ZJQBH 1):S62-S69 and Standards of Medical Care in         Diabetes - 2011,Diabetes ALPF,7902,40  (Suppl 1):S11-S61.     BNP No results found for: BNP  ProBNP    Component Value Date/Time   PROBNP 96.0 05/13/2010 0448     Lipid Panel     Component Value Date/Time   CHOL 201 (H) 06/12/2021 0946   TRIG 108 06/12/2021 0946   HDL 27 (L) 06/12/2021 0946   CHOLHDL 7.4 06/12/2021 0946   VLDL 22 06/12/2021 0946   LDLCALC 152 (H) 06/12/2021 0946     RADIOLOGY: DG Abd 1 View  Result Date: 06/10/2021 CLINICAL DATA:  NG tube placement EXAM: ABDOMEN - 1 VIEW COMPARISON:  None. FINDINGS: NG tube is in place with the tip in the descending duodenum. Nonobstructive bowel gas pattern. Prior cholecystectomy. IMPRESSION: NG tube tip in the descending duodenum. Electronically Signed   By: Rolm Baptise M.D.   On: 06/10/2021 16:39   CARDIAC CATHETERIZATION  Result Date: 06/10/2021 Formatting of this result is different from the original.   Mid LAD lesion is 80% stenosed.   Prox LAD to Mid LAD lesion is 100% stenosed.   Dist RCA lesion is 20% stenosed.   Mid RCA lesion is 20% stenosed.   A stent was successfully placed.   Post intervention, there is a 0% residual stenosis.   Post intervention, there is a 0% residual stenosis.   There is severe left ventricular systolic dysfunction. Acute anterior wall myocardial infarction secondary to total LAD occlusion at the takeoff of the first diagonal vessel complicated by VF cardiac arrest in the emergency room with successful CPR, intubation,  and ROSC. Normal large left circumflex coronary  artery and mild luminal irregularity of the dominant RCA. Severe acute LV dysfunction with EF estimated approximately 25% with severe hypokinesis to akinesis/dyskinesis of the anterolateral wall extending to the apex.  LVEDP 17 mmHg. Successful PCI to the totally occluded LAD with ultimate insertion of a 3.5 x 38 mm Resolute frontier stent postdilated to stent taper from 3.8 proximally to 3.6 at the distal aspect of the stent.  There was mild thrombus following initial stent pression at the ostium of the diagonal vessel which improved and had TIMI-3 flow. RECOMMENDATION: The patient will be admitted to to heart ICU.  Once nG-tube is placed he will be given oral Brilinta with ultimate discontinuance of cangrelor.  Continue DAPT for minimum of 12 months.  Will reinitiate heparin 10 hours post sheath removal in light of significant wall motion abnormality and thrombus at the ostium of the diagonal.  Plan 2D echo Doppler study in a.m.  Once extubated, initiate post MI ARB therapy as BP allows with possible need to transition to [facility] if LV function does not improve.  Pulmonary critical care will manage the patient's sedation and ventilation.  Initiate aggressive lipid-lowering therapy.  Smoking cessation is essential.   DG CHEST PORT 1 VIEW  Result Date: 06/10/2021 CLINICAL DATA:  Chest pain, shortness of breath EXAM: PORTABLE CHEST 1 VIEW COMPARISON:  06/10/2021 FINDINGS: NG tube coils in the stomach. Heart and mediastinal contours are within normal limits. No  focal opacities or effusions. No acute bony abnormality. IMPRESSION: No active disease. Electronically Signed   By: Rolm Baptise M.D.   On: 06/10/2021 16:37   DG Chest Port 1 View  Result Date: 06/10/2021 CLINICAL DATA:  Chest pain and shortness of breath EXAM: PORTABLE CHEST 1 VIEW COMPARISON:  05/13/2010 FINDINGS: Two AP radiographs of the chest were obtained. There are increased interstitial markings within the semi upright view which do not persist  on the upright view. Minimal linear bibasilar markings persist, likely atelectasis. Heart size is upper limits of normal, unchanged. No pleural effusion or pneumothorax. IMPRESSION: Minimal linear bibasilar atelectasis.  Lungs are otherwise clear. Electronically Signed   By: Davina Poke D.O.   On: 06/10/2021 12:58   ECHOCARDIOGRAM COMPLETE  Result Date: 06/11/2021    ECHOCARDIOGRAM REPORT   Patient Name:   NOEL RODIER Date of Exam: 06/11/2021 Medical Rec #:  [id number]       Height:       80.0 in Accession #:    [id number]      Weight:       338.6 lb Date of Birth:  [birth year]       BSA:          2.876 m Patient Age:    44 years        BP:           121/63 mmHg Patient Gender: M               HR:           86 bpm. Exam Location:  Inpatient Procedure: 2D Echo, Cardiac Doppler, Color Doppler and Intracardiac            Opacification Agent Indications:    122-I22.9 Subsequent ST elevation (STEM) and non-ST elevation                 (NSTEMI) myocardial infarction  History:        Patient has no prior history of Echocardiogram examinations.                 Acute MI, Abnormal ECG, Arrythmias:Cardiac Arrest and                 Ventricular Fibrillation, Signs/Symptoms:Chest Pain; Risk                 Factors:Current Smoker.  Sonographer:    Roseanna Rainbow RDCS Referring Phys: [place] Comments: Technically difficult study due to poor echo windows and patient is morbidly obese. Image acquisition challenging due to patient body habitus. Patient is post cath. IMPRESSIONS  1. Left ventricular ejection fraction, by estimation, is 60 to 65%. The left ventricle has normal function. The left ventricle has no regional wall motion abnormalities. There is mild concentric left ventricular hypertrophy. Left ventricular diastolic parameters were normal.  2. Right ventricular systolic function is normal. The right ventricular size is normal. There is normal pulmonary artery systolic pressure.  3. The mitral  valve is normal in structure. Trivial mitral valve regurgitation. No evidence of mitral stenosis.  4. The aortic valve is tricuspid. Aortic valve regurgitation is not visualized. No aortic stenosis is present.  5. The inferior vena cava is normal in size with greater than 50% respiratory variability, suggesting right atrial pressure of 3 mmHg. FINDINGS  Left Ventricle: Left ventricular ejection fraction, by estimation, is 60 to 65%. The left ventricle has normal function. The left ventricle has no regional wall motion  abnormalities. Definity contrast agent was given IV to delineate the left ventricular  endocardial borders. The left ventricular internal cavity size was normal in size. There is mild concentric left ventricular hypertrophy. Left ventricular diastolic parameters were normal. Indeterminate filling pressures. Right Ventricle: The right ventricular size is normal. No increase in right ventricular wall thickness. Right ventricular systolic function is normal. There is normal pulmonary artery systolic pressure. The tricuspid regurgitant velocity is 1.77 m/s, and  with an assumed right atrial pressure of 3 mmHg, the estimated right ventricular systolic pressure is 16.6 mmHg. Left Atrium: Left atrial size was normal in size. Right Atrium: Right atrial size was normal in size. Pericardium: There is no evidence of pericardial effusion. Mitral Valve: The mitral valve is normal in structure. Trivial mitral valve regurgitation. No evidence of mitral valve stenosis. Tricuspid Valve: The tricuspid valve is normal in structure. Tricuspid valve regurgitation is trivial. No evidence of tricuspid stenosis. Aortic Valve: The aortic valve is tricuspid. Aortic valve regurgitation is not visualized. No aortic stenosis is present. Pulmonic Valve: The pulmonic valve was normal in structure. Pulmonic valve regurgitation is not visualized. No evidence of pulmonic stenosis. Aorta: The aortic root is normal in size and structure.  Venous: The inferior vena cava is normal in size with greater than 50% respiratory variability, suggesting right atrial pressure of 3 mmHg. IAS/Shunts: No atrial level shunt detected by color flow Doppler.  LEFT VENTRICLE PLAX 2D LVIDd:         5.30 cm      Diastology LVIDs:         3.20 cm      LV e' medial:    9.14 cm/s LV PW:         1.10 cm      LV E/e' medial:  10.5 LV IVS:        1.20 cm      LV e' lateral:   13.70 cm/s LVOT diam:     2.30 cm      LV E/e' lateral: 7.0 LV SV:         111 LV SV Index:   38 LVOT Area:     4.15 cm  LV Volumes (MOD) LV vol d, MOD A2C: 101.0 ml LV vol d, MOD A4C: 140.5 ml LV vol s, MOD A2C: 36.1 ml LV vol s, MOD A4C: 41.8 ml LV SV MOD A2C:     64.9 ml LV SV MOD A4C:     140.5 ml LV SV MOD BP:      84.4 ml RIGHT VENTRICLE RV S prime:     16.60 cm/s TAPSE (M-mode): 1.9 cm LEFT ATRIUM           Index       RIGHT ATRIUM           Index LA diam:      3.80 cm 1.32 cm/m  RA Area:     16.20 cm LA Vol (A2C): 17.5 ml 6.08 ml/m  RA Volume:   40.40 ml  14.05 ml/m LA Vol (A4C): 45.7 ml 15.89 ml/m  AORTIC VALVE LVOT Vmax:   147.00 cm/s LVOT Vmean:  97.100 cm/s LVOT VTI:    0.266 m  AORTA Ao Root diam: 3.30 cm Ao Asc diam:  3.60 cm MITRAL VALVE               TRICUSPID VALVE MV Area (PHT): 4.39 cm    TR Peak grad:   12.5 mmHg MV Decel Time: 173 msec  TR Vmax:        177.00 cm/s MV E velocity: 95.70 cm/s MV A velocity: 74.40 cm/s  SHUNTS MV E/A ratio:  1.29        Systemic VTI:  0.27 m                            Systemic Diam: 2.30 cm Skeet Latch MD Electronically signed by Skeet Latch MD Signature Date/Time: 06/11/2021/3:52:45 PM    Final      Additional studies/ records that were reviewed today include:   EMERGENT CAT/PCI: 06/10/2021   Mid LAD lesion is 80% stenosed.   Prox LAD to Mid LAD lesion is 100% stenosed.   Dist RCA lesion is 20% stenosed.   Mid RCA lesion is 20% stenosed.   A stent was successfully placed.   Post intervention, there is a 0% residual stenosis.    Post intervention, there is a 0% residual stenosis.   There is severe left ventricular systolic dysfunction.   Acute anterior wall myocardial infarction secondary to total LAD occlusion at the takeoff of the first diagonal vessel complicated by VF cardiac arrest in the emergency room with successful CPR, intubation,  and ROSC.   Normal large left circumflex coronary artery and mild luminal irregularity of the dominant RCA.   Severe acute LV dysfunction with EF estimated approximately 25% with severe hypokinesis to akinesis/dyskinesis of the anterolateral wall extending to the apex.  LVEDP 17 mmHg.   Successful PCI to the totally occluded LAD with ultimate insertion of a 3.5 x 38 mm Resolute frontier stent postdilated to stent taper from 3.8 proximally to 3.6 at the distal aspect of the stent.  There was mild thrombus following initial stent pression at the ostium of the diagonal vessel which improved and had TIMI-3 flow.  RECOMMENDATION: The patient will be admitted to to heart ICU.  Once nG-tube is placed he will be given oral Brilinta with ultimate discontinuance of cangrelor.  Continue DAPT for minimum of 12 months.  Will reinitiate heparin 10 hours post sheath removal in light of significant wall motion abnormality and thrombus at the ostium of the diagonal.  Plan 2D echo Doppler study in a.m.  Once extubated, initiate post MI ARB therapy as BP allows with possible need to transition to [facility] if LV function does not improve.  Pulmonary critical care will manage the patient's sedation and ventilation.  Initiate aggressive lipid-lowering therapy.  Smoking cessation is essential.    Intervention   ASSESSMENT:    1. Acute ST elevation myocardial infarction (STEMI) due to occlusion of left anterior descending (LAD) coronary artery ; 06/10/2021, DES stent to [facility])   2. Cardiac arrest ([place])   3. Hyperlipidemia with target LDL less than 70   4. Class 2 severe obesity due to excess calories  with serious comorbidity and body mass index (BMI) of 37.0 to 37.9 in adult ([place])   5. TOBACCO ABUSE      PLAN:  Mr. Paiden Cavell is a 45 year old gentleman who has a remote history of PAF in 2011 felt secondary to increased caffeine use.  He has a long tobacco history and started smoking at age 7 and currently had been smoking up to 3 packs/day.  He presented with an acute anterior wall ST segment elevation myocardial infarction secondary to total [place] occluded LAD after the takeoff of the first diagonal vessel.  Upon presenting to the emergency room he developed a VF cardiac  arrest requiring CPR, intubation, initiation of amiodarone and ultimate ROSC.  Emergent catheterization demonstrated a totally occluded LAD with mild nonobstructive plaque in the RCA.  There was severe acute LV dysfunction with EF estimated at 25% with hypo and akinesis/dyskinesis of the anterolateral wall extending to the apex.  Following successful intervention with insertion of a 3.5 x 38 mm Medtronic stent postdilated with the stent taper from 3.8 to 3.6 mm, he had return of brisk TIMI-3 flow.  An echo Doppler study done the following day showed normalization of LV function without wall motion abnormality.  Presently, he has continued to do well.  He did note some mild shortness of breath with Brilinta and is aware of use of caffeine to potentially reduce that.  His shortness of breath has improved.  He has been without recurrent anginal symptomatology.  I have recommended cardiac rehabilitation.  He is now on amiodarone 200 mg daily and at present we will continue this but ultimately will plan to discontinue this if he remains stable at his next office visit.  He continues to be on DAPT with aspirin/Brilinta and should be on this for minimum of 1 year.  He is on low-dose metoprolol at 12.5 mg twice a day.  Presently, I have recommended that he stay out of work for a total of approximately 4 to 6 weeks and then can return for light  duty for several weeks prior to resumption of full strenuous activity.  I again discussed the importance of complete smoking cessation.  LDL cholesterol was 152 on presentation.  He is now on atorvastatin 80 mg daily.  In 2 months I am recommending he undergo a comprehensive metabolic panel and lipid studies.  I will see him in follow-up and further recommendations will be made at that time.   Medication Adjustments/Labs and Tests Ordered: Current medicines are reviewed at length with the patient today.  Concerns regarding medicines are outlined above.  Medication changes, Labs and Tests ordered today are listed in the Patient Instructions below. Patient Instructions  Medication Instructions:  No Changes In Medications at this time.  *If you need a refill on your cardiac medications before your next appointment, please call your pharmacy*  Lab Work: CMET, CBC, TSH, LIPIDS- PLEASE HAVE THESE DRAWN 3 DAYS PRIOR TO NEXT APPOINTMENT WITH Dr. Claiborne Billings. YOU WILL NEED TO FAST PRIOR.  If you have labs (blood work) drawn today and your tests are completely normal, you will receive your results only by: [place] (if you have MyChart) OR A paper copy in the mail If you have any lab test that is abnormal or we need to change your treatment, we will call you to review the results.  Follow-Up: At [facility], you and your health needs are our priority.  As part of our continuing mission to provide you with exceptional heart care, we have created designated Provider Care Teams.  These Care Teams include your primary Cardiologist (physician) and Advanced Practice Providers (APPs -  Physician Assistants and Nurse Practitioners) who all work together to provide you with the care you need, when you need it.  Your next appointment:   2 month(s)  The format for your next appointment:   In Person  Provider:   Shelva Majestic, MD  Other Instructions [place]. SOMEONE WILL REACH OUT TO YOU  REGARDING THIS.    Signed, Shelva Majestic, MD  07/07/2021 7:15 PM    [facility] [address],  [place], [place]  [address] Phone: [phone]

## 2021-07-07 ENCOUNTER — Encounter: Payer: Self-pay | Admitting: Cardiovascular Disease

## 2021-07-08 ENCOUNTER — Telehealth: Payer: Self-pay | Admitting: *Deleted

## 2021-07-08 NOTE — Telephone Encounter (Signed)
   Patient Name: Gary Sandoval  DOB: October 13, 1976 MRN: [id number]  Primary Cardiologist: Nicki Guadalajara, MD  Chart reviewed as part of pre-operative protocol coverage.   IF SIMPLE EXTRACTION/CLEANINGS (1-2 dental extractions): Simple dental extractions are considered low risk procedures per guidelines and generally do not require any specific cardiac clearance. It is also generally accepted that for simple extractions and dental cleanings, there is no need to interrupt blood thinner therapy.  IF MULTIPLE EXTRACTIONS OR COMPLEX DENTAL PROCEDURES (greater than 2 dental extractions): The patient was recently hospitalized for STEMI and VF arrest and underwent successful PCI to the LAD. He was placed on ASA and Brilinta and should remain on uninterrupted therapy for at least 1 year prior to interruption. If this is greater then 2 extractions or considered a complex dental procedure, I would recommend that the procedure be postponed until after the one year marker, 06/10/21.   I will route this recommendation to the requesting party via Epic fax function and remove from pre-op pool.  Please call with questions.  Georgie Chard, NP 07/08/2021, 11:27 AM

## 2021-07-08 NOTE — Telephone Encounter (Signed)
   [facility] Pre-operative Risk Assessment    Patient Name: Gary Sandoval  DOB: May 18, 1976 MRN: [id number]  HEARTCARE STAFF:  - IMPORTANT!!!!!! Under Visit Info/Reason for Call, type in Other and utilize the format Clearance MM/DD/YY or Clearance TBD. Do not use dashes or single digits. - Please review there is not already an duplicate clearance open for this procedure. - If request is for dental extraction, please clarify the # of teeth to be extracted. - If the patient is currently at the dentist's office, call Pre-Op Callback Staff (MA/nurse) to input urgent request.  - If the patient is not currently in the dentist office, please route to the Pre-Op pool.  Request for surgical clearance:  What type of surgery is being performed? Dental extractions  When is this surgery scheduled? 07/25/21  What type of clearance is required (medical clearance vs. Pharmacy clearance to hold med vs. Both)? medical  Are there any medications that need to be held prior to surgery and how long? none  Practice name and name of physician performing surgery? [facility]  What is the office phone number? [phone]   7.   What is the office fax number? [phone]  8.   Anesthesia type (None, local, MAC, general) ? local   Fredia Beets 07/08/2021, 11:15 AM  _________________________________________________________________   (provider comments below)

## 2021-07-11 NOTE — Telephone Encounter (Signed)
   Tried to call [facility] back to clarify below clearance form. Left message stating that I would provide clarification and refax form but to call back with any questions.  Patient was recently admitted in July 2022 with an acute STEMI complicated by V.Fib arrest. He underwent stenting to his proximal to mid LAD. Uninterrupted dual antiplatelet therapy with Aspirin and Brilinta was recommended for at least 12 months from time of STEMI, which would be through 06/10/2022.  Simple dental extraction (1-2 dental extractions) are considered low risk procedures per guidelines and generally do not require any specific cardiac clearance. It is also generally accepted that for simple extractions and dental cleanings, there is no need to interrupt blood thinner therapy. However, would recommended postponing complex dental procedures or multiple dental extractions (> 2 dental extractions) that would requiring holding antiplatelet therapy until after 06/10/2022.  I will route this recommendation to the requesting party via Epic fax function and remove from pre-op pool.  Please call with questions.  Corrin Parker, PA-C 07/11/2021 10:08 AM

## 2021-07-11 NOTE — Telephone Encounter (Signed)
Whitney from [facility] calling to clarify clearance. She states they have not seen patient yet, and are unsure how long the procedure should be postponed. They need clarification on whether it should be postponed a year after 06/10/2021, or if 06/10/2021 is the date it would be postponed to. She states they are not sure since it is now August of 2022. Phone: [phone]

## 2021-07-14 ENCOUNTER — Ambulatory Visit (HOSPITAL_BASED_OUTPATIENT_CLINIC_OR_DEPARTMENT_OTHER): Payer: Self-pay | Admitting: Family

## 2021-07-21 NOTE — Telephone Encounter (Signed)
Yes -- let's have him get those labs checked -- CMET, CBC, TSH and fasting lipids.  It has been enough time from his hospitalization for reassessment.  Thanks!

## 2021-07-27 LAB — COMPREHENSIVE METABOLIC PANEL
ALT: 15 IU/L (ref 0–44)
AST: 11 IU/L (ref 0–40)
Albumin/Globulin Ratio: 1.7 (ref 1.2–2.2)
Albumin: 4 g/dL (ref 4.0–5.0)
Alkaline Phosphatase: 112 IU/L (ref 44–121)
BUN/Creatinine Ratio: 8 — ABNORMAL LOW (ref 9–20)
BUN: 8 mg/dL (ref 6–24)
Bilirubin Total: 0.6 mg/dL (ref 0.0–1.2)
CO2: 22 mmol/L (ref 20–29)
Calcium: 9 mg/dL (ref 8.7–10.2)
Chloride: 102 mmol/L (ref 96–106)
Creatinine, Ser: 1.06 mg/dL (ref 0.76–1.27)
Globulin, Total: 2.4 g/dL (ref 1.5–4.5)
Glucose: 107 mg/dL — ABNORMAL HIGH (ref 65–99)
Potassium: 4.4 mmol/L (ref 3.5–5.2)
Sodium: 139 mmol/L (ref 134–144)
Total Protein: 6.4 g/dL (ref 6.0–8.5)
eGFR: 88 mL/min/{1.73_m2} (ref >59)

## 2021-07-27 LAB — CBC
Hematocrit: 46.5 % (ref 37.5–51.0)
Hemoglobin: 16.4 g/dL (ref 13.0–17.7)
MCH: 31.8 pg (ref 26.6–33.0)
MCHC: 35.3 g/dL (ref 31.5–35.7)
MCV: 90 fL (ref 79–97)
Platelets: 227 10*3/uL (ref 150–450)
RBC: 5.16 x10E6/uL (ref 4.14–5.80)
RDW: 11.8 % (ref 11.6–15.4)
WBC: 7.7 10*3/uL (ref 3.4–10.8)

## 2021-07-27 LAB — TSH: TSH: 1.43 u[IU]/mL (ref 0.450–4.500)

## 2021-07-27 LAB — LIPID PANEL
Chol/HDL Ratio: 3.6 ratio (ref 0.0–5.0)
Cholesterol, Total: 118 mg/dL (ref 100–199)
HDL: 33 mg/dL — ABNORMAL LOW (ref >39)
LDL Chol Calc (NIH): 68 mg/dL (ref 0–99)
Triglycerides: 87 mg/dL (ref 0–149)
VLDL Cholesterol Cal: 17 mg/dL (ref 5–40)

## 2021-07-27 NOTE — Progress Notes (Addendum)
Cardiology Office Note:    Date:  07/28/2021   ID:  Gary Sandoval, DOB 11/12/1976, MRN [id number]  PCP:  Lonie Peak, [facility] Cardiologist: Nicki Guadalajara, MD   Reason for visit: Hospital follow-up  History of Present Illness:    Gary Sandoval is a 45 y.o. male with a hx of paroxysmal atrial fibrillation in July 2011.  He self converted to sinus rhythm on rate control medication.  Echocardiogram at the time showed normal LV function.  Atrial fibrillation was felt to be related to significant caffeine intake and he was not placed on anticoagulation therapy due to low CHADS2vasc score.  In May 2019 he was seen by Dr. Darrick Penna of vascular surgery for painful bilateral lower extremity varicose veins for which compression therapy was recommended.  In July 2022 patient noticed increasing shortness of breath and chest pain.  On June 10, 2021 he was brought to the ER and found to have a STEMI.  In the ER he developed a ventricular fibrillation cardiac arrest requiring CPR, intubation, initiation of amiodarone therapy and had return of ROSC.  He was found to have total LAD occlusion at the takeoff of the first diagonal vessel as well as a normal left circumflex coronary artery and mild luminal irregularity of the dominant RCA.  He had severe acute LV dysfunction with EF estimated approximately 25% with severe hypokinesis to akinesis/dyskinesis of the anterolateral wall extending to the apex.   S/P DES to LAD.  On June 11, 2021 the subsequent echo Doppler study showed normalization of LV function with EF 60 to 65%.  He saw Dr. Tresa Endo on 07/05/2021 and was noted to be doing well with no recurrent angina.  He decreased his tobacco use from 3 packs/day to 8 cigarettes/day.  Today, he comes in with his wife.  He has had no further angina.  He has no shortness of breath, palpitations or syncope.  He has rare lightheadedness if he stands up too quickly.  He has chronic lower extremity edema.   His main complain is cognitive impairment post cardiac arrest.  He states he has trouble counting coins and sometimes will drop things.  Issues with memory and confusion.  Sometimes difficulty remembering what he wanted to say.  Also complains of fatigue.  He started cardiac rehab this Monday.  He asks if he can pull 60 pounds now.  He is  interested in using his bow again.  He was originally supposed to return to work on September 20.  He works as a Games developer.  He is worried about his difficulty remembering steps in a process (fixing something) and concerned that he will struggle returning to work.  He is interested in giving more time to cardiac rehab before returning to work.  We discussed a return to work date of October 1 with light duty x1 month.  Past Medical History:  Diagnosis Date   A-fib [facility])    Atrial fibrillation [facility])     Past Surgical History:  Procedure Laterality Date   CHOLECYSTECTOMY     CORONARY/GRAFT ACUTE MI REVASCULARIZATION N/A 06/10/2021   Procedure: Coronary/Graft Acute MI Revascularization;  Surgeon: Lennette Bihari, MD;  Location: [facility] INVASIVE CV LAB;  Service: Cardiovascular;  Laterality: N/A;   LEFT HEART CATH AND CORONARY ANGIOGRAPHY N/A 06/10/2021   Procedure: LEFT HEART CATH AND CORONARY ANGIOGRAPHY;  Surgeon: Lennette Bihari, MD;  Location: [facility] INVASIVE CV LAB;  Service: Cardiovascular;  Laterality: N/A;    Current Medications:  Current Meds  Medication Sig   aspirin EC 81 MG tablet Take 1 tablet (81 mg total) by mouth daily. Swallow whole.   atorvastatin (LIPITOR) 80 MG tablet Take 1 tablet (80 mg total) by mouth daily.   ECHINACEA PO Take 2 capsules by mouth 2 (two) times daily.   metoprolol tartrate (LOPRESSOR) 25 MG tablet Take 0.5 tablets (12.5 mg total) by mouth 2 (two) times daily.   nitroGLYCERIN (NITROSTAT) 0.4 MG SL tablet Place 1 tablet (0.4 mg total) under the tongue every 5 (five) minutes as needed for chest pain.   OVER THE COUNTER  MEDICATION Take 4 tablets by mouth See admin instructions. Juice plus: take 2 vegie tablets and 2 fruit tablets by mouth twice daily   pantoprazole (PROTONIX) 40 MG tablet Take 1 tablet (40 mg total) by mouth daily.   ticagrelor (BRILINTA) 90 MG TABS tablet Take 1 tablet (90 mg total) by mouth 2 (two) times daily.   [DISCONTINUED] amiodarone (PACERONE) 200 MG tablet Take 1 tablet (200 mg total) by mouth daily.   [DISCONTINUED] POTASSIUM PO Take 1 tablet by mouth 2 (two) times daily.     Allergies:   Codeine   Social History   Socioeconomic History   Marital status: Married    Spouse name: Not on file   Number of children: Not on file   Years of education: Not on file   Highest education level: Not on file  Occupational History   Not on file  Tobacco Use   Smoking status: Every Day    Packs/day: 1.00    Types: Cigarettes   Smokeless tobacco: Never  Vaping Use   Vaping Use: Never used  Substance and Sexual Activity   Alcohol use: No   Drug use: No   Sexual activity: Not on file  Other Topics Concern   Not on file  Social History Narrative   Not on file   Social Determinants of Health   Financial Resource Strain: Not on file  Food Insecurity: Not on file  Transportation Needs: Not on file  Physical Activity: Not on file  Stress: Not on file  Social Connections: Not on file     Family History: The patient's family history is not on file.  ROS:   Please see the history of present illness.     EKGs/Labs/Other Studies Reviewed:    EKG:  The ekg ordered today demonstrates sinus bradycardia, heart rate 58, QRS duration 110 ms.  Recent Labs: 06/10/2021: Magnesium 2.4 07/26/2021: ALT 15; BUN 8; Creatinine, Ser 1.06; Hemoglobin 16.4; Platelets 227; Potassium 4.4; Sodium 139; TSH 1.430  Recent Lipid Panel    Component Value Date/Time   CHOL 118 07/26/2021 0814   TRIG 87 07/26/2021 0814   HDL 33 (L) 07/26/2021 0814   CHOLHDL 3.6 07/26/2021 0814   CHOLHDL 7.4  06/12/2021 0946   VLDL 22 06/12/2021 0946   LDLCALC 68 07/26/2021 0814    Physical Exam:    VS:  BP 130/82   Pulse (!) 56   Ht 6\' 8"  (2.032 m)   Wt (!) 348 lb (157.9 kg)   SpO2 98%   BMI 38.23 kg/m     Wt Readings from Last 3 Encounters:  07/28/21 (!) 348 lb (157.9 kg)  07/05/21 (!) 341 lb 9.6 oz (154.9 kg)  06/12/21 (!) 339 lb 15.2 oz (154.2 kg)     GEN:  Well nourished, well developed in no acute distress HEENT: Normal NECK: No JVD; No carotid bruits CARDIAC: RRR,  no murmurs, rubs, gallops RESPIRATORY:  Clear to auscultation without rales, wheezing or rhonchi  ABDOMEN: Soft, non-tender, non-distended MUSCULOSKELETAL: trace edema; No deformity  SKIN: Warm and dry NEUROLOGIC:  Alert and oriented PSYCHIATRIC:  Normal affect   ASSESSMENT AND PLAN   CAD s/p STEMI and DES to LAD 05/2021 -No angina -Discontinue amiodarone since no palpitations and normal EKG. -Continue DAPT with aspirin Brilinta for minimum of 1 year.  He discussed needing extensive tooth extractions.  Recommend we wait 6 months and then rediscuss safety. -Recommend tobacco cessation  Hypertension, reasonably controlled. -I think his blood pressure will be well controlled once he quit smoking and with weight loss. - Goal BP is <130/80.  Recommend DASH diet (high in vegetables, fruits, low-fat dairy products, whole grains, poultry, fish, and nuts and low in sweets, sugar-sweetened beverages, and red meats), salt restriction and increase physical activity.  Hyperlipidemia -LDL 152 in 05/2021.  Now LDL 68 07/2021. -Continue high-dose statin. - Discussed cholesterol lowering diets - Mediterranean diet, DASH diet, vegetarian diet, low-carbohydrate diet and avoidance of trans fats.  Discussed healthier choice substitutes.  Nuts, high-fiber foods, and fiber supplements may also improve lipids.    Paroxysmal atrial fibrillation -Noted in 2011 felt secondary to increased caffeine use  Obesity - Discussed how even  a 5-10% weight loss can have cardiovascular benefits.   - Recommend moderate intensity activity for 30 minutes 5 days/week and the DASH diet.  Tobacco use  - Recommend tobacco cessation.  Reviewed physiologic effects of nicotine and the immediate-eventual benefits of quitting including improvement in cough/breathing and reduction in cardiovascular events.  Discussed quitting tips such as removing triggers and getting support from family/friends and Quitline [place].  Chronic lower extremity edema -Gave information about compression stockings.  History of hypokalemia -Noted in the hospital -Not on any diuretics.  Okay to stop potassium and check metabolic panel in 1 week.  Suspected sleep apnea -With daytime somnolence, fatigue, snoring and morbid obesity, sleep study ordered.  Disposition - Follow-up in October as already scheduled.  Readdress if patient is able to transition to full work duty in November.    Medication Adjustments/Labs and Tests Ordered: Current medicines are reviewed at length with the patient today.  Concerns regarding medicines are outlined above.  Orders Placed This Encounter  Procedures   Basic metabolic panel   EKG 12-Lead   No orders of the defined types were placed in this encounter.   Patient Instructions  Medication Instructions:  Stop Amiodarone. Stop Potassium. *If you need a refill on your cardiac medications before your next appointment, please call your pharmacy*   Lab Work: BMET : To Be Done in 1 Week. If you have labs (blood work) drawn today and your tests are completely normal, you will receive your results only by: MyChart Message (if you have MyChart) OR A paper copy in the mail If you have any lab test that is abnormal or we need to change your treatment, we will call you to review the results.   Testing/Procedures: No Testing   Follow-Up: At [facility], you and your health needs are our priority.  As part of our continuing  mission to provide you with exceptional heart care, we have created designated Provider Care Teams.  These Care Teams include your primary Cardiologist (physician) and Advanced Practice Providers (APPs -  Physician Assistants and Nurse Practitioners) who all work together to provide you with the care you need, when you need it.  We recommend signing up for  the patient portal called "MyChart".  Sign up information is provided on this After Visit Summary.  MyChart is used to connect with patients for Virtual Visits (Telemedicine).  Patients are able to view lab/test results, encounter notes, upcoming appointments, etc.  Non-urgent messages can be sent to your provider as well.   To learn more about what you can do with MyChart, go to [email or web address].    Your next appointment:   September 06, 2021 4:30 PM  The format for your next appointment:   In Person  Provider:   Nicki Guadalajara, MD   Other Instructions Consider Compression Stockings    Signed, Bernette Mayers  07/28/2021 10:46 AM    [facility]

## 2021-07-28 ENCOUNTER — Encounter: Payer: Self-pay | Admitting: Physician Assistant

## 2021-07-28 ENCOUNTER — Ambulatory Visit (INDEPENDENT_AMBULATORY_CARE_PROVIDER_SITE_OTHER): Payer: Medicaid Other | Admitting: Physician Assistant

## 2021-07-28 ENCOUNTER — Other Ambulatory Visit: Payer: Self-pay

## 2021-07-28 VITALS — BP 130/82 | HR 56 | Ht >= 80 in | Wt 348.0 lb

## 2021-07-28 DIAGNOSIS — F17200 Nicotine dependence, unspecified, uncomplicated: Secondary | ICD-10-CM

## 2021-07-28 DIAGNOSIS — I2510 Atherosclerotic heart disease of native coronary artery without angina pectoris: Secondary | ICD-10-CM | POA: Diagnosis not present

## 2021-07-28 DIAGNOSIS — E785 Hyperlipidemia, unspecified: Secondary | ICD-10-CM

## 2021-07-28 DIAGNOSIS — R600 Localized edema: Secondary | ICD-10-CM

## 2021-07-28 DIAGNOSIS — I10 Essential (primary) hypertension: Secondary | ICD-10-CM

## 2021-07-28 DIAGNOSIS — E876 Hypokalemia: Secondary | ICD-10-CM

## 2021-07-28 NOTE — Progress Notes (Signed)
       To whom it may concern,  I saw Mr. Gary Sandoval in clinic today.  He has a history of an myocardial infarction and cardiac arrest at the end of July 2022.  He had a very complicated course and is improving.   He is very lucky to have made the recovery he has made so far.  I have made some medication changes today.  He has just started cardiac rehab this week.   I think he would greatly benefit from further cardiac rehab before returning to work.  I recommend returning to work on October 1 with light duty x1 month.  He has a cardiac reassessment on October 25.    If you have any questions, please do not hesitate to reach out.  Signed, Cannon Kettle, PA-C  07/28/2021 10:31 AM    [facility]

## 2021-07-28 NOTE — Patient Instructions (Signed)
Medication Instructions:  Stop Amiodarone. Stop Potassium. *If you need a refill on your cardiac medications before your next appointment, please call your pharmacy*   Lab Work: BMET : To Be Done in 1 Week. If you have labs (blood work) drawn today and your tests are completely normal, you will receive your results only by: MyChart Message (if you have MyChart) OR A paper copy in the mail If you have any lab test that is abnormal or we need to change your treatment, we will call you to review the results.   Testing/Procedures: No Testing   Follow-Up: At [facility], you and your health needs are our priority.  As part of our continuing mission to provide you with exceptional heart care, we have created designated Provider Care Teams.  These Care Teams include your primary Cardiologist (physician) and Advanced Practice Providers (APPs -  Physician Assistants and Nurse Practitioners) who all work together to provide you with the care you need, when you need it.  We recommend signing up for the patient portal called "MyChart".  Sign up information is provided on this After Visit Summary.  MyChart is used to connect with patients for Virtual Visits (Telemedicine).  Patients are able to view lab/test results, encounter notes, upcoming appointments, etc.  Non-urgent messages can be sent to your provider as well.   To learn more about what you can do with MyChart, go to [email or web address].    Your next appointment:   September 06, 2021 4:30 PM  The format for your next appointment:   In Person  Provider:   Nicki Guadalajara, MD   Other Instructions Consider Compression Stockings

## 2021-08-04 DIAGNOSIS — M7989 Other specified soft tissue disorders: Secondary | ICD-10-CM

## 2021-08-06 LAB — BASIC METABOLIC PANEL
BUN/Creatinine Ratio: 8 — ABNORMAL LOW (ref 9–20)
BUN: 9 mg/dL (ref 6–24)
CO2: 21 mmol/L (ref 20–29)
Calcium: 9.2 mg/dL (ref 8.7–10.2)
Chloride: 101 mmol/L (ref 96–106)
Creatinine, Ser: 1.12 mg/dL (ref 0.76–1.27)
Glucose: 126 mg/dL — ABNORMAL HIGH (ref 65–99)
Potassium: 4.6 mmol/L (ref 3.5–5.2)
Sodium: 139 mmol/L (ref 134–144)
eGFR: 82 mL/min/{1.73_m2} (ref >59)

## 2021-08-11 ENCOUNTER — Telehealth: Payer: Self-pay

## 2021-08-11 NOTE — Telephone Encounter (Addendum)
Patient view results through MyChart----- Message from Cannon Kettle, PA-C sent at 08/11/2021  9:35 AM EDT ----- Your potassium and kidney function are normal.  This shows that if you stop taking potassium after our last visit, you do not need potassium supplements.  You are good.

## 2021-08-16 ENCOUNTER — Other Ambulatory Visit: Payer: Self-pay

## 2021-08-16 ENCOUNTER — Emergency Department (HOSPITAL_COMMUNITY): Admission: EM | Admit: 2021-08-16 | Discharge: 2021-08-16 | Discharge status: Against Medical Advice | Payer: MEDICAID

## 2021-08-16 NOTE — ED Notes (Signed)
Patient called for triage, no answered x2

## 2021-08-16 NOTE — Telephone Encounter (Signed)
Called pt regarding mychart message. Pt state he returned to work yesterday and developed left shoulder pain that radiated to his chest as he was walking up stairs. Pt denies any other symptoms other than a headache and feeling exhausted   Pt report he is currently experiencing discomfort. Nurse advised based on current symptoms to report to ER for further evaluations. Pt verbalized understanding.

## 2021-08-18 ENCOUNTER — Encounter: Payer: Self-pay | Admitting: Physician Assistant

## 2021-08-30 ENCOUNTER — Other Ambulatory Visit: Payer: Self-pay | Admitting: Physician Assistant

## 2021-09-06 ENCOUNTER — Encounter: Payer: Self-pay | Admitting: Cardiovascular Disease

## 2021-09-06 ENCOUNTER — Other Ambulatory Visit: Payer: Self-pay

## 2021-09-06 ENCOUNTER — Ambulatory Visit (INDEPENDENT_AMBULATORY_CARE_PROVIDER_SITE_OTHER): Payer: Medicaid Other | Admitting: Cardiovascular Disease

## 2021-09-06 DIAGNOSIS — I10 Essential (primary) hypertension: Secondary | ICD-10-CM | POA: Diagnosis not present

## 2021-09-06 DIAGNOSIS — I2102 ST elevation (STEMI) myocardial infarction involving left anterior descending coronary artery: Secondary | ICD-10-CM | POA: Diagnosis not present

## 2021-09-06 DIAGNOSIS — F17200 Nicotine dependence, unspecified, uncomplicated: Secondary | ICD-10-CM

## 2021-09-06 DIAGNOSIS — E785 Hyperlipidemia, unspecified: Secondary | ICD-10-CM

## 2021-09-06 DIAGNOSIS — I2510 Atherosclerotic heart disease of native coronary artery without angina pectoris: Secondary | ICD-10-CM | POA: Diagnosis not present

## 2021-09-06 DIAGNOSIS — E876 Hypokalemia: Secondary | ICD-10-CM

## 2021-09-06 MED ORDER — METOPROLOL SUCCINATE ER 50 MG PO TB24
ORAL_TABLET | ORAL | 3 refills | Status: DC
Start: 2021-09-06 — End: 2022-05-26

## 2021-09-06 NOTE — Progress Notes (Signed)
Cardiology Office Note    Date:  09/08/2021   ID:  Gary Sandoval, DOB [birth year], MRN [id number]  PCP:  Cyndi Bender, PA-C  Cardiologist:  Shelva Majestic, MD   2 month F/U office visit   History of Present Illness:  Gary Sandoval is a 45 y.o. male who I saw on July 05, 2021 for his initial office evaluation following his acute anterior wall myocardial infarction secondary to total LAD occlusion complicated by VF cardiac arrest in the emergency room with successful CPR, intubation and ROSC.  He presents for follow-up evaluation  Gary Sandoval has a prior history of paroxysmal atrial fibrillation in July 2011.  He self converted to sinus rhythm on rate control medication.  Echocardiogram at the time showed normal LV function.  Atrial fibrillation was felt to be related to significant caffeine intake and he was not placed on anticoagulation therapy due to low CHADS2vasc score.  In May 2019 he was seen by Dr. Oneida Alar of vascular surgery for painful bilateral lower extremity varicose veins for which compression therapy was recommended.  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  Late July the patient began to notice episodes of increasing shortness of breath.  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  He was brought by EMS to the emergency room and his ECG showed early ST elevation in aVR in lead V1.  In the ER he developed a ventricular fibrillation cardiac arrest requiring CPR, intubation, initiation of amiodarone therapy and had return of ROSC.  He was taken emergently to the cardiac catheterization laboratory where I performed emergent cardiac catheterization.  He was found to have total LAD occlusion at the takeoff of the first diagonal vessel as well as a normal left circumflex coronary artery and mild luminal irregularity of the dominant RCA.  He had severe acute LV dysfunction with EF estimated approximately 25%  with severe hypokinesis to akinesis/dyskinesis of the anterolateral wall extending to the apex.  LVEDP was 17 mm.  He underwent successful PCI to the totally occluded LAD with ultimate insertion of a 3.5 x 38 mm Medtronic Onyx frontier stent postdilated to stent taper from 3.8-3.6.  There was mild thrombus following initial stent insertion at the ostium of the diagonal vessel which improved and had TIMI-3 flow.  The patient did remarkably well and was ultimately extubated.  He was discharged 2 days later on June 12, 2021.  On June 11, 2021 the subsequent echo Doppler study showed normalization of LV function with EF 60 to 65%.  I saw him for my initial evaluation with me in the office on July 05, 2021.  Since hospital discharge, patient has done well.  He has been on a regimen consisting of aspirin/Brilinta for DAPT, metoprolol tartrate 12.5 mg twice a day, amiodarone 200 mg daily, and atorvastatin 80 mg.  He denies recurrent chest pain.  He has reduced his 3 pack/day tobacco habit down to approximately 8 cigarettes/day with plans for complete discontinuance.  He had started smoking at age 76.  He was unaware of palpitations.  He has not yet returned to work.  During that evaluation I recommended that he stay out of work for approximately 4 to 6 weeks and then can return to light duty for several weeks prior to resumption of full strenuous activity.  I discussed the importance of complete smoking cessation.  His initial LDL was 152 and he  was now on atorvastatin 80 mg daily.  Apparently, he tried to return to work but there is really no light work activity and as result he has not been working presently.  He believes he is having some memory issues.  He has been participating in cardiac rehab.  He underwent subsequent laboratory on July 26, 2021 which showed significant improvement in his lipids with LDL cholesterol at 68.  Renal function was stable with creatinine 1.06.  He denies any chest pain.  He  states his blood pressure at home has been running around 275 or less systolically.  He has been taking metoprolol tartrate 12.5 mg twice a day, DAPT with aspirin/Brilinta, and atorvastatin 80 mg daily in addition to pantoprazole.  He presents for follow-up evaluation.  Past Medical History:  Diagnosis Date   A-fib [facility])    Atrial fibrillation [facility])     Past Surgical History:  Procedure Laterality Date   CHOLECYSTECTOMY     CORONARY/GRAFT ACUTE MI REVASCULARIZATION N/A 06/10/2021   Procedure: Coronary/Graft Acute MI Revascularization;  Surgeon: Troy Sine, MD;  Location: [place] [facility];  Service: Cardiovascular;  Laterality: N/A;   LEFT HEART CATH AND CORONARY ANGIOGRAPHY N/A 06/10/2021   Procedure: LEFT HEART CATH AND CORONARY ANGIOGRAPHY;  Surgeon: Troy Sine, MD;  Location: [place] [facility];  Service: Cardiovascular;  Laterality: N/A;    Current Medications: Outpatient Medications Prior to Visit  Medication Sig Dispense Refill   aspirin EC 81 MG tablet Take 1 tablet (81 mg total) by mouth daily. Swallow whole. 150 tablet 2   atorvastatin (LIPITOR) 80 MG tablet Take 1 tablet (80 mg total) by mouth daily. 90 tablet 3   ECHINACEA PO Take 2 capsules by mouth 2 (two) times daily.     nitroGLYCERIN (NITROSTAT) 0.4 MG SL tablet Place 1 tablet (0.4 mg total) under the tongue every 5 (five) minutes as needed for chest pain. 25 tablet 3   OVER THE COUNTER MEDICATION Take 4 tablets by mouth See admin instructions. Juice plus: take 2 vegie tablets and 2 fruit tablets by mouth twice daily     pantoprazole (PROTONIX) 40 MG tablet Take 1 tablet (40 mg total) by mouth daily. 90 tablet 3   ticagrelor (BRILINTA) 90 MG TABS tablet Take 1 tablet (90 mg total) by mouth 2 (two) times daily. 180 tablet 3   metoprolol tartrate (LOPRESSOR) 25 MG tablet Take 0.5 tablets (12.5 mg total) by mouth 2 (two) times daily. 180 tablet 3   No facility-administered medications prior to visit.      Allergies:   Codeine   Social History   Socioeconomic History   Marital status: Married    Spouse name: Not on file   Number of children: Not on file   Years of education: Not on file   Highest education level: Not on file  Occupational History   Not on file  Tobacco Use   Smoking status: Every Day    Packs/day: 1.00    Types: Cigarettes   Smokeless tobacco: Never  Vaping Use   Vaping Use: Never used  Substance and Sexual Activity   Alcohol use: No   Drug use: No   Sexual activity: Not on file  Other Topics Concern   Not on file  Social History Narrative   Not on file   Social Determinants of Health   Financial Resource Strain: Not on file  Food Insecurity: Not on file  Transportation Needs: Not on file  Physical Activity:  Not on file  Stress: Not on file  Social Connections: Not on file    Socially he is married.  He started smoking at age 19 and had smoked up to 3 packs/day.  He works as a Engineer, building services which requires heavy lifting, pushing and pulling.  He has a history of obesity.  Family History: No family history on file  ROS General: Negative; No fevers, chills, or night sweats; obesity HEENT: Negative; No changes in vision or hearing, sinus congestion, difficulty swallowing Pulmonary: Negative; No cough, wheezing, shortness of breath, hemoptysis Cardiovascular: See HPI, history of varicose veins GI: Negative; No nausea, vomiting, diarrhea, or abdominal pain GU: History of anorectal abscess Musculoskeletal: Negative; no myalgias, joint pain, or weakness Hematologic/Oncology: Negative; no easy bruising, bleeding Endocrine: Negative; no heat/cold intolerance; no diabetes Neuro: Negative; no changes in balance, headaches Skin: Negative; No rashes or skin lesions Psychiatric: Negative; No behavioral problems, depression Sleep: Negative; No snoring, daytime sleepiness, hypersomnolence, bruxism, restless legs, hypnogognic hallucinations, no  cataplexy Other comprehensive 14 point system review is negative.   PHYSICAL EXAM:   VS:  BP 132/86 (BP Location: Right Arm)   Pulse 77   Ht $R'6\' 8"'yB$  (2.032 m)   Wt (!) 349 lb 6.4 oz (158.5 kg)   BMI 38.38 kg/m     Repeat blood pressure by me was elevated at 150/86.  Wt Readings from Last 3 Encounters:  09/06/21 (!) 349 lb 6.4 oz (158.5 kg)  07/28/21 (!) 348 lb (157.9 kg)  07/05/21 (!) 341 lb 9.6 oz (154.9 kg)    General: Alert, oriented, no distress.  Tall, 6 feet 8 inches Skin: normal turgor, no rashes, warm and dry HEENT: Normocephalic, atraumatic. Pupils equal round and reactive to light; sclera anicteric; extraocular muscles intact; bearded Nose without nasal septal hypertrophy Mouth/Parynx benign; Mallinpatti scale Neck: No JVD, no carotid bruits; normal carotid upstroke Lungs: clear to ausculatation and percussion; no wheezing or rales Chest wall: without tenderness to palpitation Heart: PMI not displaced, RRR, s1 s2 normal, 1/6 systolic murmur, no diastolic murmur, no rubs, gallops, thrills, or heaves Abdomen: soft, nontender; no hepatosplenomehaly, BS+; abdominal aorta nontender and not dilated by palpation. Back: no CVA tenderness Pulses 2+ Musculoskeletal: full range of motion, normal strength, no joint deformities Extremities: no clubbing cyanosis or edema, Homan's sign negative  Neurologic: grossly nonfocal; Cranial nerves grossly wnl Psychologic: Normal mood and affect  Studies/Labs Reviewed:   September 06, 2021 ECG (independently read by me):  NSR at 77, no ST changes, no ectopy  July 05, 2021 ECG (independently read by me):  NSR  at 61, nonspecific T wave abnormality; early transition; QTc 446 msec  Recent Labs: BMP Latest Ref Rng & Units 08/05/2021 07/26/2021 06/12/2021  Glucose 65 - 99 mg/dL 126(H) 107(H) 125(H)  BUN 6 - 24 mg/dL $Remove'9 8 6  'DNWGGAG$ Creatinine 0.76 - 1.27 mg/dL 1.12 1.06 1.00  BUN/Creat Ratio 9 - 20 8(L) 8(L) -  Sodium 134 - 144 mmol/L 139 139 135   Potassium 3.5 - 5.2 mmol/L 4.6 4.4 3.7  Chloride 96 - 106 mmol/L 101 102 104  CO2 20 - 29 mmol/L $RemoveB'21 22 24  'wqWAaCDd$ Calcium 8.7 - 10.2 mg/dL 9.2 9.0 8.5(L)     Hepatic Function Latest Ref Rng & Units 07/26/2021 06/11/2021 06/10/2021  Total Protein 6.0 - 8.5 g/dL 6.4 6.0(L) 6.9  Albumin 4.0 - 5.0 g/dL 4.0 3.4(L) 4.0  AST 0 - 40 IU/L 11 39 18  ALT 0 - 44 IU/L 15 26 15  Alk Phosphatase 44 - 121 IU/L 112 74 69  Total Bilirubin 0.0 - 1.2 mg/dL 0.6 0.7 1.4(H)  Bilirubin, Direct 0.0 - 0.2 mg/dL - 0.1 -    CBC Latest Ref Rng & Units 07/26/2021 06/12/2021 06/11/2021  WBC 3.4 - 10.8 x10E3/uL 7.7 8.6 10.2  Hemoglobin 13.0 - 17.7 g/dL 16.4 14.1 14.9  Hematocrit 37.5 - 51.0 % 46.5 41.2 46.0  Platelets 150 - 450 x10E3/uL 227 167 105(L)   Lab Results  Component Value Date   MCV 90 07/26/2021   MCV 94.1 06/12/2021   MCV 99.1 06/11/2021    Lab Results  Component Value Date   HGBA1C  05/13/2010    5.4 (NOTE)                                                                       According to the ADA Clinical Practice Recommendations for 2011, when HbA1c is used as a screening test:   >=6.5%   Diagnostic of Diabetes Mellitus           (if abnormal result  is confirmed)  5.7-6.4%   Increased risk of developing Diabetes Mellitus  References:Diagnosis and Classification of Diabetes Mellitus,Diabetes XWRU,0454,09(WJXBJ 1):S62-S69 and Standards of Medical Care in         Diabetes - 2011,Diabetes Care,2011,34  (Suppl 1):S11-S61.     BNP No results found for: BNP  ProBNP    Component Value Date/Time   PROBNP 96.0 05/13/2010 0448     Lipid Panel     Component Value Date/Time   CHOL 118 07/26/2021 0814   TRIG 87 07/26/2021 0814   HDL 33 (L) 07/26/2021 0814   CHOLHDL 3.6 07/26/2021 0814   CHOLHDL 7.4 06/12/2021 0946   VLDL 22 06/12/2021 0946   LDLCALC 68 07/26/2021 0814   LABVLDL 17 07/26/2021 0814     RADIOLOGY: No results found.   Additional studies/ records that were reviewed today  include:   EMERGENT CAT/PCI: 06/10/2021   Mid LAD lesion is 80% stenosed.   Prox LAD to Mid LAD lesion is 100% stenosed.   Dist RCA lesion is 20% stenosed.   Mid RCA lesion is 20% stenosed.   A stent was successfully placed.   Post intervention, there is a 0% residual stenosis.   Post intervention, there is a 0% residual stenosis.   There is severe left ventricular systolic dysfunction.   Acute anterior wall myocardial infarction secondary to total LAD occlusion at the takeoff of the first diagonal vessel complicated by VF cardiac arrest in the emergency room with successful CPR, intubation,  and ROSC.   Normal large left circumflex coronary artery and mild luminal irregularity of the dominant RCA.   Severe acute LV dysfunction with EF estimated approximately 25% with severe hypokinesis to akinesis/dyskinesis of the anterolateral wall extending to the apex.  LVEDP 17 mmHg.   Successful PCI to the totally occluded LAD with ultimate insertion of a 3.5 x 38 mm Resolute frontier stent postdilated to stent taper from 3.8 proximally to 3.6 at the distal aspect of the stent.  There was mild thrombus following initial stent pression at the ostium of the diagonal vessel which improved and had TIMI-3 flow.  RECOMMENDATION: The patient will be admitted to to  heart ICU.  Once nG-tube is placed he will be given oral Brilinta with ultimate discontinuance of cangrelor.  Continue DAPT for minimum of 12 months.  Will reinitiate heparin 10 hours post sheath removal in light of significant wall motion abnormality and thrombus at the ostium of the diagonal.  Plan 2D echo Doppler study in a.m.  Once extubated, initiate post MI ARB therapy as BP allows with possible need to transition to [facility] if LV function does not improve.  Pulmonary critical care will manage the patient's sedation and ventilation.  Initiate aggressive lipid-lowering therapy.  Smoking cessation is essential.    Intervention    ECHO:  06/11/2021 IMPRESSIONS   1. Left ventricular ejection fraction, by estimation, is 60 to 65%. The  left ventricle has normal function. The left ventricle has no regional  wall motion abnormalities. There is mild concentric left ventricular  hypertrophy. Left ventricular diastolic  parameters were normal.   2. Right ventricular systolic function is normal. The right ventricular  size is normal. There is normal pulmonary artery systolic pressure.   3. The mitral valve is normal in structure. Trivial mitral valve  regurgitation. No evidence of mitral stenosis.   4. The aortic valve is tricuspid. Aortic valve regurgitation is not  visualized. No aortic stenosis is present.   5. The inferior vena cava is normal in size with greater than 50%  respiratory variability, suggesting right atrial pressure of 3 mmHg.  ASSESSMENT:    1. Acute ST elevation myocardial infarction (STEMI) due to occlusion of left anterior descending (LAD) coronary artery [facility]): June 10, 2021 with DES stent to LAD   2. Coronary artery disease involving native coronary artery of native heart without angina pectoris   3. Hyperlipidemia with target LDL less than 70   4. Essential hypertension   5. Hypokalemia   6. Tobaco abuse     PLAN:  Gary Sandoval is a 45 year-old gentleman who has a remote history of PAF in 2011 felt secondary to increased caffeine use.  He has a long tobacco history and started smoking at age 75 and currently had been smoking up to 3 packs/day.  He presented with an acute anterior wall ST segment elevation myocardial infarction secondary to an occluded LAD after the takeoff of the first diagonal vessel.  Upon presenting to the emergency room he developed a VF cardiac arrest requiring CPR, intubation, initiation of amiodarone and ultimate ROSC.  Emergent catheterization demonstrated a totally occluded LAD with mild nonobstructive plaque in the RCA.  There was severe acute LV dysfunction with EF estimated  at 25% with hypo and akinesis/dyskinesis of the anterolateral wall extending to the apex.  Following successful intervention with insertion of a 3.5 x 38 mm Medtronic stent postdilated with the stent taper from 3.8 to 3.6 mm, he had return of brisk TIMI-3 flow.  An echo Doppler study done the following day showed normalization of LV function without wall motion abnormality.  Presently, he has continued to do well.  He did note some mild shortness of breath with Brilinta and is aware of use of caffeine to potentially reduce that.  His shortness of breath has improved.  He has been without recurrent anginal symptomatology.  I have recommended cardiac rehabilitation.  When I initially saw him in the office in August 2021, he was on amiodarone 200 mg daily and subsequently, this has been discontinued.  He has not had any recurrent anginal symptomatology.  He is having some mild memory issues.  He is participating in cardiac rehab.  He tried to go back to work but there was no light duty availability and subsequently he has not been back at work since.  His blood pressure today is elevated but he states at home typically has been stable.  He continues to be on atorvastatin, most recent LDL cholesterol significantly improved at 68 compared to previously at 152.  For medication simplification I am recommending changing metoprolol to tartrate to metoprolol succinate initially 25 mg for 2 weeks but depending upon blood pressure and heart rate, increase to 50 mg daily.  He is not having any anginal symptomatology.  We discussed the importance of weight loss and exercise.  Repeat laboratory will be obtained in 3 months and I will see him in the office for follow-up evaluation.   Medication Adjustments/Labs and Tests Ordered: Current medicines are reviewed at length with the patient today.  Concerns regarding medicines are outlined above.  Medication changes, Labs and Tests ordered today are listed in the Patient  Instructions below. Patient Instructions  Medication Instructions:  STOP TAKING METOPROLOL TARTRATE.  START TAKING METOPROLOL SUCCINATE ONE-HALF TABLET (25 MG) FOR 2 WEEKS, THEN TAKE THE WHOLE TABLET (50 MG) AFTERWARD.  *If you need a refill on your cardiac medications before your next appointment, please call your pharmacy*   Lab Work: FASTING LAB WORK BEFORE YOUR NEXT APPOINTMENT (CMET, CBC, TSH, LIPID) If you have labs (blood work) drawn today and your tests are completely normal, you will receive your results only by: [place] (if you have MyChart) OR A paper copy in the mail If you have any lab test that is abnormal or we need to change your treatment, we will call you to review the results.   Follow-Up: At [facility], you and your health needs are our priority.  As part of our continuing mission to provide you with exceptional heart care, we have created designated Provider Care Teams.  These Care Teams include your primary Cardiologist (physician) and Advanced Practice Providers (APPs -  Physician Assistants and Nurse Practitioners) who all work together to provide you with the care you need, when you need it.  We recommend signing up for the patient portal called "MyChart".  Sign up information is provided on this After Visit Summary.  MyChart is used to connect with patients for Virtual Visits (Telemedicine).  Patients are able to view lab/test results, encounter notes, upcoming appointments, etc.  Non-urgent messages can be sent to your provider as well.   To learn more about what you can do with MyChart, go to [email or web address].    Your next appointment:   3 month(s)  The format for your next appointment:   In Person  Provider:   Shelva Majestic, MD      Signed, Shelva Majestic, MD  09/08/2021 [place] [address], [place], [place], [place]  [address] Phone: [phone]

## 2021-09-06 NOTE — Patient Instructions (Signed)
Medication Instructions:  STOP TAKING METOPROLOL TARTRATE.  START TAKING METOPROLOL SUCCINATE ONE-HALF TABLET (25 MG) FOR 2 WEEKS, THEN TAKE THE WHOLE TABLET (50 MG) AFTERWARD.  *If you need a refill on your cardiac medications before your next appointment, please call your pharmacy*   Lab Work: FASTING LAB WORK BEFORE YOUR NEXT APPOINTMENT (CMET, CBC, TSH, LIPID) If you have labs (blood work) drawn today and your tests are completely normal, you will receive your results only by: MyChart Message (if you have MyChart) OR A paper copy in the mail If you have any lab test that is abnormal or we need to change your treatment, we will call you to review the results.   Follow-Up: At [facility], you and your health needs are our priority.  As part of our continuing mission to provide you with exceptional heart care, we have created designated Provider Care Teams.  These Care Teams include your primary Cardiologist (physician) and Advanced Practice Providers (APPs -  Physician Assistants and Nurse Practitioners) who all work together to provide you with the care you need, when you need it.  We recommend signing up for the patient portal called "MyChart".  Sign up information is provided on this After Visit Summary.  MyChart is used to connect with patients for Virtual Visits (Telemedicine).  Patients are able to view lab/test results, encounter notes, upcoming appointments, etc.  Non-urgent messages can be sent to your provider as well.   To learn more about what you can do with MyChart, go to [email or web address].    Your next appointment:   3 month(s)  The format for your next appointment:   In Person  Provider:   Nicki Guadalajara, MD

## 2021-09-08 ENCOUNTER — Encounter: Payer: Self-pay | Admitting: Cardiovascular Disease

## 2021-09-08 ENCOUNTER — Telehealth: Payer: Self-pay | Admitting: Cardiovascular Disease

## 2021-09-08 NOTE — Telephone Encounter (Signed)
Patient would like to switch to Dr Dulce Sellar in [place] because its closer to where he lives. Please advise

## 2021-09-09 ENCOUNTER — Encounter: Payer: Self-pay | Admitting: Cardiology

## 2021-09-09 NOTE — Telephone Encounter (Signed)
error 

## 2021-09-12 NOTE — Telephone Encounter (Signed)
Called pt regarding mychart message. Pt report left leg swelling since yesterday. He state swelling is located in calf area and the front of his leg. He denies redness or warmth but report occasional pain.   Dr. Jens Som (DOD) made aware and recommended an ultrasound.  Appointment scheduled for 09/13/21 @ 11 am.

## 2021-09-13 ENCOUNTER — Ambulatory Visit (HOSPITAL_COMMUNITY)
Admission: RE | Admit: 2021-09-13 | Discharge: 2021-09-13 | Disposition: A | Discharge status: Home / Self Care | Payer: Medicaid Other | Source: Ambulatory Visit | Attending: Cardiovascular Disease | Admitting: Cardiovascular Disease

## 2021-09-13 ENCOUNTER — Other Ambulatory Visit: Payer: Self-pay

## 2021-09-13 DIAGNOSIS — M7989 Other specified soft tissue disorders: Secondary | ICD-10-CM | POA: Diagnosis not present

## 2021-09-22 ENCOUNTER — Other Ambulatory Visit: Payer: Self-pay

## 2021-09-22 DIAGNOSIS — R400 Somnolence: Secondary | ICD-10-CM

## 2021-09-22 DIAGNOSIS — R5383 Other fatigue: Secondary | ICD-10-CM

## 2021-09-22 NOTE — Progress Notes (Signed)
Order for sleep study placed

## 2021-09-23 NOTE — Telephone Encounter (Signed)
Ok by me

## 2021-11-08 ENCOUNTER — Ambulatory Visit (HOSPITAL_BASED_OUTPATIENT_CLINIC_OR_DEPARTMENT_OTHER): Payer: Medicaid Other | Attending: Physician Assistant | Admitting: Cardiovascular Disease

## 2021-11-08 ENCOUNTER — Other Ambulatory Visit: Payer: Self-pay

## 2021-11-08 DIAGNOSIS — R400 Somnolence: Secondary | ICD-10-CM | POA: Diagnosis not present

## 2021-11-08 DIAGNOSIS — G4736 Sleep related hypoventilation in conditions classified elsewhere: Secondary | ICD-10-CM | POA: Diagnosis not present

## 2021-11-08 DIAGNOSIS — R5383 Other fatigue: Secondary | ICD-10-CM | POA: Diagnosis not present

## 2021-11-08 DIAGNOSIS — R0683 Snoring: Secondary | ICD-10-CM

## 2021-11-08 DIAGNOSIS — G4733 Obstructive sleep apnea (adult) (pediatric): Secondary | ICD-10-CM | POA: Diagnosis not present

## 2021-11-08 DIAGNOSIS — I4891 Unspecified atrial fibrillation: Secondary | ICD-10-CM | POA: Diagnosis not present

## 2021-11-25 ENCOUNTER — Telehealth: Payer: Self-pay | Admitting: *Deleted

## 2021-11-25 ENCOUNTER — Other Ambulatory Visit: Payer: Self-pay | Admitting: Cardiovascular Disease

## 2021-11-25 ENCOUNTER — Encounter (HOSPITAL_BASED_OUTPATIENT_CLINIC_OR_DEPARTMENT_OTHER): Payer: Self-pay | Admitting: Cardiovascular Disease

## 2021-11-25 DIAGNOSIS — I4891 Unspecified atrial fibrillation: Secondary | ICD-10-CM

## 2021-11-25 DIAGNOSIS — G4733 Obstructive sleep apnea (adult) (pediatric): Secondary | ICD-10-CM

## 2021-11-25 NOTE — Telephone Encounter (Signed)
-----   Message from Troy Sine, MD sent at 11/25/2021  8:52 AM EST ----- Mariann Laster, please notify pt and set of CPAP; can initiate Auto-PAP trial

## 2021-11-25 NOTE — Telephone Encounter (Signed)
Patient notified of sleep study results and recommendations. He agrees to proceed with CPAP titration. 

## 2021-11-25 NOTE — Procedures (Signed)
Patient Name: Gary Sandoval, Gary Sandoval Date: 11/08/2021 Gender: Male D.O.B: Jul 16, 1976 Age (years): 45 Referring Provider: Caron Presume PA-C Height (inches): 80 Interpreting Physician: Shelva Majestic MD, ABSM Weight (lbs): 347 RPSGT: Laren Everts BMI: 38 MRN: [id number] Neck Size: 19.00  CLINICAL INFORMATION Sleep Study Type: NPSG  Indication for sleep study: Excessive Daytime Sleepiness, Fatigue, Obesity, Snoring  Epworth Sleepiness Score: 6  SLEEP STUDY TECHNIQUE As per the AASM Manual for the Scoring of Sleep and Associated Events v2.3 (April 2016) with a hypopnea requiring 4% desaturations.  The channels recorded and monitored were frontal, central and occipital EEG, electrooculogram (EOG), submentalis EMG (chin), nasal and oral airflow, thoracic and abdominal wall motion, anterior tibialis EMG, snore microphone, electrocardiogram, and pulse oximetry.  MEDICATIONS aspirin EC 81 MG tablet atorvastatin (LIPITOR) 80 MG tablet ECHINACEA PO metoprolol succinate (TOPROL-XL) 50 MG 24 hr tablet nitroGLYCERIN (NITROSTAT) 0.4 MG SL tablet OVER THE COUNTER MEDICATION pantoprazole (PROTONIX) 40 MG tablet ticagrelor (BRILINTA) 90 MG TABS tablet  Medications self-administered by patient taken the night of the study : N/A  SLEEP ARCHITECTURE The study was initiated at 9:57:08 PM and ended at 5:00:08 AM.  Sleep onset time was 8.8 minutes and the sleep efficiency was 87.5%%. The total sleep time was 370 minutes.  Stage REM latency was 100.0 minutes.  The patient spent 8.8%% of the night in stage N1 sleep, 65.0%% in stage N2 sleep, 0.0%% in stage N3 and 26.2% in REM.  Alpha intrusion was absent.  Supine sleep was 51.49%.  RESPIRATORY PARAMETERS The overall apnea/hypopnea index (AHI) was 7.6 per hour.  The respiratory disturbance index (RDI) was 11.2/h. There were 0 total apneas, including 0 obstructive, 0 central and 0 mixed apneas. There were 47 hypopneas and 22  RERAs.  The AHI during Stage REM sleep was 25.4 per hour.  AHI while supine was 14.8 per hour.  The mean oxygen saturation was 93.1%. The minimum SpO2 during sleep was 85.0%.  Moderate continuous snoring was noted during this study.  CARDIAC DATA The 2 lead EKG demonstrated sinus rhythm. The mean heart rate was 58.2 beats per minute. Other EKG findings include: PVCs.  LEG MOVEMENT DATA The total PLMS were 0 with a resulting PLMS index of 0.0. Associated arousal with leg movement index was 0.0 .  IMPRESSIONS - Mild obstructive sleep apnea overall (AHI 7.6/h; RDI 11.2/h); however, moderate sleep apnea was present during REM sleep (AHI 25.4/h). - Mild oxygen desaturationto a nadir of 85%. - The patient snored with moderate snoring volume. - EKG findings include PVCs. - Clinically significant periodic limb movements did not occur during sleep. No significant associated arousals.  DIAGNOSIS - Obstructive Sleep Apnea (G47.33) - Nocturnal Hypoxemia (G47.36) - Snoring  RECOMMENDATIONS - Recommend CPAP therapy for the patient's sleep disordered breathing. Consider an initial trial of Auto-PAP with EPR of 3 and 6 - 16 cm of water. - Effort should be made to optimize nasal and oropharyngeal patency.  - Positional therapy avoiding supine position during sleep. - If patient is against CPAP initiation, alternative to CPAP therapy such as a customized oral appliance can be considered.  - Avoid alcohol, sedatives and other CNS depressants that may worsen sleep apnea and disrupt normal sleep architecture. - Sleep hygiene should be reviewed to assess factors that may improve sleep quality. - Weight management (BMI 38) and regular exercise should be initiated or continued if appropriate.  [Electronically signed] 11/25/2021 08:45 AM  Shelva Majestic MD, [facility], ABSM Diplomate, American Board of Sleep  Medicine   NPI: [id number] [place] PH: [phone]   FX: [phone] [place]

## 2021-11-30 ENCOUNTER — Ambulatory Visit (HOSPITAL_BASED_OUTPATIENT_CLINIC_OR_DEPARTMENT_OTHER): Payer: Medicaid Other | Attending: Cardiovascular Disease | Admitting: Cardiovascular Disease

## 2021-11-30 ENCOUNTER — Other Ambulatory Visit: Payer: Self-pay

## 2021-11-30 DIAGNOSIS — G4733 Obstructive sleep apnea (adult) (pediatric): Secondary | ICD-10-CM | POA: Diagnosis present

## 2021-11-30 DIAGNOSIS — I4891 Unspecified atrial fibrillation: Secondary | ICD-10-CM | POA: Diagnosis not present

## 2021-11-30 DIAGNOSIS — I493 Ventricular premature depolarization: Secondary | ICD-10-CM | POA: Diagnosis not present

## 2021-12-03 LAB — COMPREHENSIVE METABOLIC PANEL
ALT: 14 IU/L (ref 0–44)
AST: 15 IU/L (ref 0–40)
Albumin/Globulin Ratio: 2 (ref 1.2–2.2)
Albumin: 4.3 g/dL (ref 4.0–5.0)
Alkaline Phosphatase: 102 IU/L (ref 44–121)
BUN/Creatinine Ratio: 10 (ref 9–20)
BUN: 9 mg/dL (ref 6–24)
Bilirubin Total: 0.6 mg/dL (ref 0.0–1.2)
CO2: 24 mmol/L (ref 20–29)
Calcium: 9.1 mg/dL (ref 8.7–10.2)
Chloride: 102 mmol/L (ref 96–106)
Creatinine, Ser: 0.93 mg/dL (ref 0.76–1.27)
Globulin, Total: 2.2 g/dL (ref 1.5–4.5)
Glucose: 105 mg/dL — ABNORMAL HIGH (ref 70–99)
Potassium: 4.5 mmol/L (ref 3.5–5.2)
Sodium: 139 mmol/L (ref 134–144)
Total Protein: 6.5 g/dL (ref 6.0–8.5)
eGFR: 102 mL/min/{1.73_m2} (ref >59)

## 2021-12-03 LAB — TSH: TSH: 1.01 u[IU]/mL (ref 0.450–4.500)

## 2021-12-03 LAB — LIPID PANEL
Chol/HDL Ratio: 4.4 ratio (ref 0.0–5.0)
Cholesterol, Total: 135 mg/dL (ref 100–199)
HDL: 31 mg/dL — ABNORMAL LOW (ref >39)
LDL Chol Calc (NIH): 87 mg/dL (ref 0–99)
Triglycerides: 91 mg/dL (ref 0–149)
VLDL Cholesterol Cal: 17 mg/dL (ref 5–40)

## 2021-12-03 LAB — CBC
Hematocrit: 49.2 % (ref 37.5–51.0)
Hemoglobin: 16.8 g/dL (ref 13.0–17.7)
MCH: 31 pg (ref 26.6–33.0)
MCHC: 34.1 g/dL (ref 31.5–35.7)
MCV: 91 fL (ref 79–97)
Platelets: 240 10*3/uL (ref 150–450)
RBC: 5.42 x10E6/uL (ref 4.14–5.80)
RDW: 12.5 % (ref 11.6–15.4)
WBC: 7.7 10*3/uL (ref 3.4–10.8)

## 2021-12-06 NOTE — Progress Notes (Signed)
Cardiology Office Note:    Date:  12/07/2021   ID:  Gary Sandoval, DOB 03/28/1976, MRN [id number]  PCP:  Cyndi Bender, [place] Cardiologist: Shelva Majestic, MD   Reason for visit: 51-month follow-up  History of Present Illness:    Gary Sandoval is a 46 y.o. male with a hx of hx of paroxysmal atrial fibrillation in July 2011 & tobacco use.  He self converted to sinus rhythm on rate control medication.  Echocardiogram at the time showed normal LV function.  Atrial fibrillation was felt to be related to significant caffeine intake and he was not placed on anticoagulation therapy due to low CHADS2vasc score.     In July 2022 patient noticed increasing shortness of breath and chest pain.  On June 10, 2021 he was brought to the ER and found to have a STEMI.  In the ER he developed a ventricular fibrillation cardiac arrest requiring CPR, intubation, initiation of amiodarone therapy and had return of ROSC.  He was found to have total LAD occlusion at the takeoff of the first diagonal vessel as well as a normal left circumflex coronary artery and mild luminal irregularity of the dominant RCA.  He had severe acute LV dysfunction with EF estimated approximately 25% with severe hypokinesis to akinesis/dyskinesis of the anterolateral wall extending to the apex.   S/P DES to LAD.  On June 11, 2021 the subsequent echo Doppler study showed normalization of LV function with EF 60 to 65%.  Today, patient again mentions difficulty with memory & completing tasks post cardiac arrest.  He is going through the application for disability.  Patient underwent sleep study.  He states he is waiting to get a CPAP machine.  Otherwise, patient states he is not getting any sleep at night.  He has less physical activity now that he is not working.  His weight has gone up 8 pounds since last visit.  He has increased dyspnea on exertion.  He has decreased stamina.  He is continuing to cut back on tobacco use.   He states he is compliant with his medications.  He has rare chest pain that relieves with a burp.  Nothing compared to his symptoms with his STEMI.  He denies PND & orthopnea.  His lower extremity edema is better when he wears compression stockings.     Past Medical History:  Diagnosis Date   A-fib [facility])    Atrial fibrillation [facility])     Past Surgical History:  Procedure Laterality Date   CHOLECYSTECTOMY     CORONARY/GRAFT ACUTE MI REVASCULARIZATION N/A 06/10/2021   Procedure: Coronary/Graft Acute MI Revascularization;  Surgeon: Troy Sine, MD;  Location: [place] [facility];  Service: Cardiovascular;  Laterality: N/A;   LEFT HEART CATH AND CORONARY ANGIOGRAPHY N/A 06/10/2021   Procedure: LEFT HEART CATH AND CORONARY ANGIOGRAPHY;  Surgeon: Troy Sine, MD;  Location: [place] [facility];  Service: Cardiovascular;  Laterality: N/A;    Current Medications: Current Meds  Medication Sig   aspirin EC 81 MG tablet Take 1 tablet (81 mg total) by mouth daily. Swallow whole.   atorvastatin (LIPITOR) 80 MG tablet Take 1 tablet (80 mg total) by mouth daily.   ECHINACEA PO Take 2 capsules by mouth 2 (two) times daily.   metoprolol succinate (TOPROL-XL) 50 MG 24 hr tablet Take one-half tablet (25 mg) for 2 weeks, then take the whole tablet (50 mg) afterward.   nitroGLYCERIN (NITROSTAT) 0.4 MG SL tablet Place 1  tablet (0.4 mg total) under the tongue every 5 (five) minutes as needed for chest pain.   OVER THE COUNTER MEDICATION Take 4 tablets by mouth See admin instructions. Juice plus: take 2 vegie tablets and 2 fruit tablets by mouth twice daily   pantoprazole (PROTONIX) 40 MG tablet Take 1 tablet (40 mg total) by mouth daily.   ticagrelor (BRILINTA) 90 MG TABS tablet Take 1 tablet (90 mg total) by mouth 2 (two) times daily.     Allergies:   Codeine   Social History   Socioeconomic History   Marital status: Married    Spouse name: Not on file   Number of children: Not on file   Years  of education: Not on file   Highest education level: Not on file  Occupational History   Not on file  Tobacco Use   Smoking status: Every Day    Packs/day: 1.00    Types: Cigarettes   Smokeless tobacco: Never  Vaping Use   Vaping Use: Never used  Substance and Sexual Activity   Alcohol use: No   Drug use: No   Sexual activity: Not on file  Other Topics Concern   Not on file  Social History Narrative   Not on file   Social Determinants of Health   Financial Resource Strain: Not on file  Food Insecurity: Not on file  Transportation Needs: Not on file  Physical Activity: Not on file  Stress: Not on file  Social Connections: Not on file     Family History: The patient's family history is not on file.  ROS:   Please see the history of present illness.     EKGs/Labs/Other Studies Reviewed:    Recent Labs: 06/10/2021: Magnesium 2.4 12/02/2021: ALT 14; BUN 9; Creatinine, Ser 0.93; Hemoglobin 16.8; Platelets 240; Potassium 4.5; Sodium 139; TSH 1.010   Recent Lipid Panel Lab Results  Component Value Date/Time   CHOL 135 12/02/2021 08:21 AM   TRIG 91 12/02/2021 08:21 AM   HDL 31 (L) 12/02/2021 08:21 AM   LDLCALC 87 12/02/2021 08:21 AM    Physical Exam:    VS:  BP (!) 142/76    Pulse 84    Ht 6\' 8"  (2.032 m)    Wt (!) 357 lb 3.2 oz (162 kg)    SpO2 100%    BMI 39.24 kg/m    No data found.  Wt Readings from Last 3 Encounters:  12/07/21 (!) 357 lb 3.2 oz (162 kg)  11/30/21 (!) 347 lb (157.4 kg)  11/08/21 (!) 347 lb (157.4 kg)     GEN:  Well nourished, well developed in no acute distress, obese HEENT: Normal NECK: No JVD; No carotid bruits CARDIAC: RRR, no murmurs, rubs, gallops RESPIRATORY:  Clear to auscultation without rales, wheezing or rhonchi  ABDOMEN: Soft, non-tender, non-distended MUSCULOSKELETAL: Trace edema; wearing compression stockings SKIN: Warm and dry NEUROLOGIC:  Alert and oriented PSYCHIATRIC:  Normal affect     ASSESSMENT AND PLAN    CAD  s/p STEMI and DES to LAD 05/2021 -No angina -Continue DAPT with aspirin & Brilinta for minimum of 1 year.  Follow-up in July with Dr. Tresa Endo to discuss if able to stop Brilinta or change to lower maintenance dose. -Recommend tobacco cessation   Hypertension, BP elevated today -Believe blood pressure is up given weight gain and untreated sleep apnea. -Would favor weight loss and treatment with CPAP before adding more medications.  Refer to the healthy weight & wellness clinic.  Patient wife  plan to join [employer]. - Goal BP is <130/80.  Recommend DASH diet (high in vegetables, fruits, low-fat dairy products, whole grains, poultry, fish, and nuts and low in sweets, sugar-sweetened beverages, and red meats), salt restriction and increase physical activity.   Hyperlipidemia - LDL up to 87 in 11/2021 with weight gain and decreased physical activity.  Continue Lipitor 80 mg daily.  Refer for weight loss.  Consider rechecking lipids in 6 months to see if lifestyle intervention brings LDL to goal.  If LDL still elevated, could consider adding Zetia or Repatha. - Discussed cholesterol lowering diets - Mediterranean diet, DASH diet, vegetarian diet, low-carbohydrate diet and avoidance of trans fats.  Discussed healthier choice substitutes.  Nuts, high-fiber foods, and fiber supplements may also improve lipids.    Sleep apnea, untreated -Contacted Barry Brunner to follow-up on getting patient a CPAP machine.   Paroxysmal atrial fibrillation -Noted in 2011 felt secondary to increased caffeine use   Obesity - Discussed how even a 5-10% weight loss can have cardiovascular benefits.   - Recommend moderate intensity activity for 30 minutes 5 days/week and the DASH diet.   Tobacco use  - Recommend tobacco cessation.   Chronic lower extremity edema -Recommend compression stockings.   Disposition - Follow-up in July with Dr. Claiborne Billings.  At that time, ensure patient is using CPAP, follow-up lipid control,  blood pressure and weight loss efforts.  Discuss Brilinta use at 1 year post PCI.          Medication Adjustments/Labs and Tests Ordered: Current medicines are reviewed at length with the patient today.  Concerns regarding medicines are outlined above.  Orders Placed This Encounter  Procedures   Amb Ref to Medical Weight Management   No orders of the defined types were placed in this encounter.   Patient Instructions  Medication Instructions:  No Changes *If you need a refill on your cardiac medications before your next appointment, please call your pharmacy*   Lab Work: No Labs If you have labs (blood work) drawn today and your tests are completely normal, you will receive your results only by: [place] (if you have MyChart) OR A paper copy in the mail If you have any lab test that is abnormal or we need to change your treatment, we will call you to review the results.   Testing/Procedures: No Testing   Follow-Up: At [facility], you and your health needs are our priority.  As part of our continuing mission to provide you with exceptional heart care, we have created designated Provider Care Teams.  These Care Teams include your primary Cardiologist (physician) and Advanced Practice Providers (APPs -  Physician Assistants and Nurse Practitioners) who all work together to provide you with the care you need, when you need it.  We recommend signing up for the patient portal called "MyChart".  Sign up information is provided on this After Visit Summary.  MyChart is used to connect with patients for Virtual Visits (Telemedicine).  Patients are able to view lab/test results, encounter notes, upcoming appointments, etc.  Non-urgent messages can be sent to your provider as well.   To learn more about what you can do with MyChart, go to [email or web address].    Your next appointment:   6 month(s)  The format for your next appointment:   In Person  Provider:   Shelva Majestic, MD     Other Instructions Increase physical activity. Consider [employer].    Signed, Warren Lacy, PA-C  12/07/2021 9:49 AM    [facility]

## 2021-12-07 ENCOUNTER — Encounter: Payer: Self-pay | Admitting: Physician Assistant

## 2021-12-07 ENCOUNTER — Other Ambulatory Visit: Payer: Self-pay

## 2021-12-07 ENCOUNTER — Ambulatory Visit (INDEPENDENT_AMBULATORY_CARE_PROVIDER_SITE_OTHER): Payer: Medicaid Other | Admitting: Physician Assistant

## 2021-12-07 VITALS — BP 142/76 | HR 84 | Ht >= 80 in | Wt 357.2 lb

## 2021-12-07 DIAGNOSIS — I4891 Unspecified atrial fibrillation: Secondary | ICD-10-CM

## 2021-12-07 DIAGNOSIS — I10 Essential (primary) hypertension: Secondary | ICD-10-CM

## 2021-12-07 DIAGNOSIS — I2510 Atherosclerotic heart disease of native coronary artery without angina pectoris: Secondary | ICD-10-CM | POA: Diagnosis not present

## 2021-12-07 DIAGNOSIS — E785 Hyperlipidemia, unspecified: Secondary | ICD-10-CM

## 2021-12-07 DIAGNOSIS — G4733 Obstructive sleep apnea (adult) (pediatric): Secondary | ICD-10-CM

## 2021-12-07 NOTE — Patient Instructions (Signed)
Medication Instructions:  No Changes *If you need a refill on your cardiac medications before your next appointment, please call your pharmacy*   Lab Work: No Labs If you have labs (blood work) drawn today and your tests are completely normal, you will receive your results only by: MyChart Message (if you have MyChart) OR A paper copy in the mail If you have any lab test that is abnormal or we need to change your treatment, we will call you to review the results.   Testing/Procedures: No Testing   Follow-Up: At [facility], you and your health needs are our priority.  As part of our continuing mission to provide you with exceptional heart care, we have created designated Provider Care Teams.  These Care Teams include your primary Cardiologist (physician) and Advanced Practice Providers (APPs -  Physician Assistants and Nurse Practitioners) who all work together to provide you with the care you need, when you need it.  We recommend signing up for the patient portal called "MyChart".  Sign up information is provided on this After Visit Summary.  MyChart is used to connect with patients for Virtual Visits (Telemedicine).  Patients are able to view lab/test results, encounter notes, upcoming appointments, etc.  Non-urgent messages can be sent to your provider as well.   To learn more about what you can do with MyChart, go to [email or web address].    Your next appointment:   6 month(s)  The format for your next appointment:   In Person  Provider:   Nicki Gu[place], MD     Other Instructions Increase physical activity. Consider [employer].

## 2021-12-14 ENCOUNTER — Encounter: Payer: Self-pay | Admitting: Cardiovascular Disease

## 2021-12-16 ENCOUNTER — Other Ambulatory Visit: Payer: Self-pay

## 2021-12-16 MED ORDER — EZETIMIBE 10 MG PO TABS: 10.0000 mg | ORAL_TABLET | Freq: Every day | ORAL | 3 refills | Status: DC

## 2021-12-25 ENCOUNTER — Encounter (HOSPITAL_BASED_OUTPATIENT_CLINIC_OR_DEPARTMENT_OTHER): Payer: Self-pay | Admitting: Cardiovascular Disease

## 2021-12-25 NOTE — Procedures (Signed)
Patient Name: Gary Sandoval, Gary Sandoval Date: 11/30/2021 Gender: Male D.O.B: 1976-06-27 Age (years): 45 Referring Provider: Caron Presume PA-C Height (inches): 80 Interpreting Physician: Shelva Majestic MD, ABSM Weight (lbs): 347 RPSGT: Laren Everts BMI: 38 MRN: [id number] Neck Size: 19.00  CLINICAL INFORMATION The patient is referred for a CPAP titration to treat sleep apnea.  Date of NPSG: 11/08/2021: AHI 7.6/h; RDI 11.2/h; REM AHI 25.4/h; supine AHI 14.8/h; O 2 nadir 85%.  SLEEP STUDY TECHNIQUE As per the AASM Manual for the Scoring of Sleep and Associated Events v2.3 (April 2016) with a hypopnea requiring 4% desaturations.  The channels recorded and monitored were frontal, central and occipital EEG, electrooculogram (EOG), submentalis EMG (chin), nasal and oral airflow, thoracic and abdominal wall motion, anterior tibialis EMG, snore microphone, electrocardiogram, and pulse oximetry. Continuous positive airway pressure (CPAP) was initiated at the beginning of the study and titrated to treat sleep-disordered breathing.  MEDICATIONS aspirin EC 81 MG tablet atorvastatin (LIPITOR) 80 MG tablet ECHINACEA PO ezetimibe (ZETIA) 10 MG tablet metoprolol succinate (TOPROL-XL) 50 MG 24 hr tablet nitroGLYCERIN (NITROSTAT) 0.4 MG SL tablet OVER THE COUNTER MEDICATION pantoprazole (PROTONIX) 40 MG tablet ticagrelor (BRILINTA) 90 MG TABS tablet  Medications self-administered by patient taken the night of the study : N/A  TECHNICIAN COMMENTS Comments added by technician: None Comments added by scorer: N/A  RESPIRATORY PARAMETERS Optimal PAP Pressure (cm): 10 AHI at Optimal Pressure (/hr): 0 Overall Minimal O2 (%): 89.0 Supine % at Optimal Pressure (%): 0 Minimal O2 at Optimal Pressure (%): 92.0   SLEEP ARCHITECTURE The study was initiated at 10:32:02 PM and ended at 5:33:40 AM.  Sleep onset time was 4.6 minutes and the sleep efficiency was 91.4%%. The total sleep time  was 385.5 minutes.  The patient spent 5.8%% of the night in stage N1 sleep, 58.4%% in stage N2 sleep, 0.0%% in stage N3 and 35.8% in REM.Stage REM latency was 47.5 minutes  Wake after sleep onset was 31.6. Alpha intrusion was absent. Supine sleep was 43.61%.  CARDIAC DATA The 2 lead EKG demonstrated sinus rhythm. The mean heart rate was 57.5 beats per minute. Other EKG findings include: PVCs.  LEG MOVEMENT DATA The total Periodic Limb Movements of Sleep (PLMS) were 0. The PLMS index was 0.0. A PLMS index of <15 is considered normal in adults.  IMPRESSIONS - CPAP was initiated at 5 cm and was titrated to optimal PAP pressure at 10 cm of water (AHI 0. RDI 2.9/h: O2 nadir 92%). - Mild oxygen desaturations were observed during this titration to a nadir of 89% at 8 cm of water. - The patient snored with soft snoring volume during this titration study. - 2-lead EKG demonstrated: rare isolated PVC - Clinically significant periodic limb movements were not noted during this study. Arousals associated with PLMs were rare.   DIAGNOSIS - Obstructive Sleep Apnea (G47.33)   RECOMMENDATIONS - Recommend an initial trial of CPAP Auto therapy with EPR of 3 at  10 - 14 cm H2O with heated humidification.  A Medium size [employer] Full Face Mask Simplus mask was used for thje titration. - Effort should be made to optimize nasal and oropharyngeal patency. - Avoid alcohol, sedatives and other CNS depressants that may worsen sleep apnea and disrupt normal sleep architecture. - Sleep hygiene should be reviewed to assess factors that may improve sleep quality. - Weight management [facility])  and regular exercise should be initiated or continued. - Recommend a downlload and sleep clinic evaluation after 4  weeks of therapy   [Electronically signed] 12/25/2021 02:28 PM  Shelva Majestic MD, [facility], ABSM Diplomate, American Board of Sleep Medicine   NPI: [id number]  [place] PH: [phone]   FX: [phone] [place]

## 2021-12-26 ENCOUNTER — Encounter (HOSPITAL_BASED_OUTPATIENT_CLINIC_OR_DEPARTMENT_OTHER): Payer: Medicaid Other | Admitting: Cardiovascular Disease

## 2021-12-26 ENCOUNTER — Encounter: Payer: Self-pay | Admitting: Cardiovascular Disease

## 2021-12-27 ENCOUNTER — Telehealth: Payer: Self-pay | Admitting: *Deleted

## 2021-12-27 NOTE — Telephone Encounter (Signed)
Both the patient and his wife were notified the CPAP titration has been completed. Order for CPAP will be sent to [facility].

## 2021-12-27 NOTE — Telephone Encounter (Signed)
-----   Message from Lennette Bihari, MD sent at 12/25/2021  2:33 PM EST ----- Gary Sandoval please notify pt and set up with DME for initiation of CPAP

## 2022-05-22 ENCOUNTER — Encounter: Payer: Self-pay | Admitting: Cardiovascular Disease

## 2022-05-24 ENCOUNTER — Other Ambulatory Visit: Payer: Self-pay

## 2022-05-24 DIAGNOSIS — R5383 Other fatigue: Secondary | ICD-10-CM

## 2022-05-24 DIAGNOSIS — Z79899 Other long term (current) drug therapy: Secondary | ICD-10-CM

## 2022-05-24 NOTE — Progress Notes (Signed)
Orders for lab work placed. Orders placed in outgoing mail.

## 2022-05-26 ENCOUNTER — Encounter: Payer: Self-pay | Admitting: Cardiovascular Disease

## 2022-05-26 ENCOUNTER — Other Ambulatory Visit: Payer: Self-pay

## 2022-05-26 DIAGNOSIS — I10 Essential (primary) hypertension: Secondary | ICD-10-CM

## 2022-05-26 MED ORDER — TICAGRELOR 90 MG PO TABS: 90.0000 mg | ORAL_TABLET | Freq: Two times a day (BID) | ORAL | 3 refills | Status: DC

## 2022-05-26 MED ORDER — ATORVASTATIN CALCIUM 80 MG PO TABS: 80.0000 mg | ORAL_TABLET | Freq: Every day | ORAL | 3 refills | Status: DC

## 2022-05-26 MED ORDER — METOPROLOL SUCCINATE ER 50 MG PO TB24
ORAL_TABLET | ORAL | 3 refills | Status: DC
Start: 2022-05-26 — End: 2023-06-11

## 2022-05-26 MED ORDER — PANTOPRAZOLE SODIUM 40 MG PO TBEC: 40.0000 mg | DELAYED_RELEASE_TABLET | Freq: Every day | ORAL | 3 refills | Status: DC

## 2022-05-26 MED ORDER — EZETIMIBE 10 MG PO TABS: 10.0000 mg | ORAL_TABLET | Freq: Every day | ORAL | 3 refills | Status: DC

## 2022-06-16 ENCOUNTER — Telehealth: Payer: Self-pay | Admitting: *Deleted

## 2022-06-16 NOTE — Telephone Encounter (Signed)
Patient called to inform me he has not received his CPAP machine. He has been communicating with Jasmine December at [facility] but does not understand the hold up. I called Jasmine December and was informed by her that his Medicaid approved his machine, however they "keyed in" the incorrect cod, resulting in approval of the wrong machine. BIPAP  machine was approved instead of a CPAP machine. Jasmine December is waiting for Medicaid to correct their error. She hopes to hear back from them within the next week. I called patient back and explained this to him. He voiced understanding of what was told to him.

## 2022-06-21 LAB — CBC WITH DIFFERENTIAL/PLATELET
Basophils Absolute: 0.1 x10E3/uL (ref 0.0–0.2)
Basos: 1 %
EOS (ABSOLUTE): 0.2 x10E3/uL (ref 0.0–0.4)
Eos: 3 %
Hematocrit: 44.9 % (ref 37.5–51.0)
Hemoglobin: 15.7 g/dL (ref 13.0–17.7)
Immature Grans (Abs): 0 x10E3/uL (ref 0.0–0.1)
Immature Granulocytes: 0 %
Lymphocytes Absolute: 2.3 x10E3/uL (ref 0.7–3.1)
Lymphs: 32 %
MCH: 32 pg (ref 26.6–33.0)
MCHC: 35 g/dL (ref 31.5–35.7)
MCV: 91 fL (ref 79–97)
Monocytes Absolute: 0.6 x10E3/uL (ref 0.1–0.9)
Monocytes: 8 %
Neutrophils Absolute: 4 x10E3/uL (ref 1.4–7.0)
Neutrophils: 56 %
Platelets: 205 x10E3/uL (ref 150–450)
RBC: 4.91 x10E6/uL (ref 4.14–5.80)
RDW: 12.3 % (ref 11.6–15.4)
WBC: 7.1 x10E3/uL (ref 3.4–10.8)

## 2022-06-21 LAB — BASIC METABOLIC PANEL WITH GFR
BUN/Creatinine Ratio: 10 (ref 9–20)
BUN: 9 mg/dL (ref 6–24)
CO2: 21 mmol/L (ref 20–29)
Calcium: 9 mg/dL (ref 8.7–10.2)
Chloride: 102 mmol/L (ref 96–106)
Creatinine, Ser: 0.94 mg/dL (ref 0.76–1.27)
Glucose: 100 mg/dL — ABNORMAL HIGH (ref 70–99)
Potassium: 4.2 mmol/L (ref 3.5–5.2)
Sodium: 138 mmol/L (ref 134–144)
eGFR: 101 mL/min/1.73

## 2022-06-26 NOTE — Progress Notes (Unsigned)
Cardiology Clinic Note   Patient Name: Gary Sandoval Date of Encounter: 06/27/2022  Primary Care Provider:  Lonie Peak, PA-C Primary Cardiologist:  Nicki Guadalajara, MD  Patient Profile    Gary Sandoval 46 year old male presents the clinic today for follow-up evaluation of his coronary artery disease and essential hypertension.  Past Medical History    Past Medical History:  Diagnosis Date   A-fib [facility])    Atrial fibrillation [facility])    Past Surgical History:  Procedure Laterality Date   CHOLECYSTECTOMY     CORONARY/GRAFT ACUTE MI REVASCULARIZATION N/A 06/10/2021   Procedure: Coronary/Graft Acute MI Revascularization;  Surgeon: Lennette Bihari, MD;  Location: [facility] INVASIVE CV LAB;  Service: Cardiovascular;  Laterality: N/A;   LEFT HEART CATH AND CORONARY ANGIOGRAPHY N/A 06/10/2021   Procedure: LEFT HEART CATH AND CORONARY ANGIOGRAPHY;  Surgeon: Lennette Bihari, MD;  Location: [facility] INVASIVE CV LAB;  Service: Cardiovascular;  Laterality: N/A;    Allergies  Allergies  Allergen Reactions   Codeine Other (See Comments)    Makes him act wierd    History of Present Illness    Gary Sandoval has a PMH of atrial fibrillation, essential hypertension, coronary artery disease, hyperlipidemia, hypokalemia, and tobacco abuse.  He underwent cardiac catheterization in the setting of anterior MI which was complicated by V-fib arrest.  He received DES to his LAD.  His echocardiogram 06/12/2021 showed an LVEF of 60 to 65%.  He was seen in follow-up 8/22.  During that time he continued to do well.  He was continued on aspirin, Brilinta, metoprolol, amiodarone, and a atorvastatin.  He denied recurrent chest discomfort.  He had reduced his smoking to 8 cigarettes/day.  He was unaware of palpitations.  It was recommended that he stay out of work for 6/8 weeks and return to light duty after.  He was seen in follow-up on 09/06/2021.  During that time he reported that he had not returned to work  because there was no light duty work for him.  He believes he was having some memory issues.  He had been participating in cardiac rehab.  His lab work 9/22 showed LDL of 68.  His renal function was stable at 1.06.  He denied chest pain.  His blood pressure is well controlled.  He presents to the clinic today for follow-up evaluation states he continues with increased fatigue and shortness of breath.  He reports that he tries to increase his physical activity but is limited due to fatigue that he has noticed since his MI.  We reviewed his previous angiography and echocardiogram.  We reviewed his medication.  He indicates that he has been eating a reduced calorie diet but has not been able to lose weight.  He does note occasional episodes of edema both on his arms and his legs.  He requests diuresis.  We reviewed his smoking.  He reports that he continues to try to reduce the amount that he smokes.  He has not yet received his CPAP device.  I will order a repeat echocardiogram, BMP, as needed furosemide, give smoking cessation information, and plan follow-up for 1 month.  Today he denies chest pain, shortness of breath, lower extremity edema, fatigue, palpitations, melena, hematuria, hemoptysis, diaphoresis, weakness, presyncope, syncope, orthopnea, and PND.    Home Medications    Prior to Admission medications   Medication Sig Start Date End Date Taking? Authorizing Provider  atorvastatin (LIPITOR) 80 MG tablet Take 1 tablet (80 mg total)  by mouth daily. 05/26/22   Lennette Bihari, MD  ECHINACEA PO Take 2 capsules by mouth 2 (two) times daily.    [provider]  ezetimibe (ZETIA) 10 MG tablet Take 1 tablet (10 mg total) by mouth daily. 05/26/22 05/21/23  Lennette Bihari, MD  metoprolol succinate (TOPROL-XL) 50 MG 24 hr tablet Take one-half tablet (25 mg) for 2 weeks, then take the whole tablet (50 mg) afterward. 05/26/22   Lennette Bihari, MD  nitroGLYCERIN (NITROSTAT) 0.4 MG SL tablet Place 1  tablet (0.4 mg total) under the tongue every 5 (five) minutes as needed for chest pain. 06/12/21 06/12/22  [facility], Roe Rutherford, PA  OVER THE COUNTER MEDICATION Take 4 tablets by mouth See admin instructions. Juice plus: take 2 vegie tablets and 2 fruit tablets by mouth twice daily    [provider]  pantoprazole (PROTONIX) 40 MG tablet Take 1 tablet (40 mg total) by mouth daily. 05/26/22   Lennette Bihari, MD  ticagrelor (BRILINTA) 90 MG TABS tablet Take 1 tablet (90 mg total) by mouth 2 (two) times daily. 05/26/22   Lennette Bihari, MD    Family History    History reviewed. No pertinent family history. has no family status information on file.   Social History    Social History   Socioeconomic History   Marital status: Married    Spouse name: Not on file   Number of children: Not on file   Years of education: Not on file   Highest education level: Not on file  Occupational History   Not on file  Tobacco Use   Smoking status: Every Day    Packs/day: 1.00    Types: Cigarettes   Smokeless tobacco: Never  Vaping Use   Vaping Use: Never used  Substance and Sexual Activity   Alcohol use: No   Drug use: No   Sexual activity: Not on file  Other Topics Concern   Not on file  Social History Narrative   Not on file   Social Determinants of Health   Financial Resource Strain: Not on file  Food Insecurity: Not on file  Transportation Needs: Not on file  Physical Activity: Not on file  Stress: Not on file  Social Connections: Not on file  Intimate Partner Violence: Not on file     Review of Systems    General:  No chills, fever, night sweats or weight changes.  Cardiovascular:  No chest pain, dyspnea on exertion, edema, orthopnea, palpitations, paroxysmal nocturnal dyspnea. Dermatological: No rash, lesions/masses Respiratory: No cough, dyspnea Urologic: No hematuria, dysuria Abdominal:   No nausea, vomiting, diarrhea, bright red blood per rectum, melena, or  hematemesis Neurologic:  No visual changes, wkns, changes in mental status. All other systems reviewed and are otherwise negative except as noted above.  Physical Exam    VS:  BP 120/86   Pulse 64   Ht 6\' 8"  (2.032 m)   Wt (!) 365 lb (165.6 kg)   SpO2 96%   BMI 40.10 kg/m  , BMI Body mass index is 40.1 kg/m. GEN: Well nourished, well developed, in no acute distress. HEENT: normal. Neck: Supple, no JVD, carotid bruits, or masses. Cardiac: RRR, no murmurs, rubs, or gallops. No clubbing, cyanosis, generalized bilateral lower extremity nonpitting edema.  Radials/DP/PT 2+ and equal bilaterally.  Respiratory:  Respirations regular and unlabored, clear to auscultation bilaterally. GI: Soft, nontender, nondistended, BS + x 4. MS: no deformity or atrophy. Skin: warm and dry, no  rash. Neuro:  Strength and sensation are intact. Psych: Normal affect.  Accessory Clinical Findings    Recent Labs: 12/02/2021: ALT 14; TSH 1.010 06/20/2022: BUN 9; Creatinine, Ser 0.94; Hemoglobin 15.7; Platelets 205; Potassium 4.2; Sodium 138   Recent Lipid Panel    Component Value Date/Time   CHOL 135 12/02/2021 0821   TRIG 91 12/02/2021 0821   HDL 31 (L) 12/02/2021 0821   CHOLHDL 4.4 12/02/2021 0821   CHOLHDL 7.4 06/12/2021 0946   VLDL 22 06/12/2021 0946   LDLCALC 87 12/02/2021 0821    ECG personally reviewed by me today-normal sinus rhythm with sinus arrhythmia 64 bpm no acute changes.    Echocardiogram 06/11/2021  IMPRESSIONS     1. Left ventricular ejection fraction, by estimation, is 60 to 65%. The  left ventricle has normal function. The left ventricle has no regional  wall motion abnormalities. There is mild concentric left ventricular  hypertrophy. Left ventricular diastolic  parameters were normal.   2. Right ventricular systolic function is normal. The right ventricular  size is normal. There is normal pulmonary artery systolic pressure.   3. The mitral valve is normal in structure.  Trivial mitral valve  regurgitation. No evidence of mitral stenosis.   4. The aortic valve is tricuspid. Aortic valve regurgitation is not  visualized. No aortic stenosis is present.   5. The inferior vena cava is normal in size with greater than 50%  respiratory variability, suggesting right atrial pressure of 3 mmHg.  Cardiac catheterization 06/11/2019    Mid LAD lesion is 80% stenosed.   Prox LAD to Mid LAD lesion is 100% stenosed.   Dist RCA lesion is 20% stenosed.   Mid RCA lesion is 20% stenosed.   A stent was successfully placed.   Post intervention, there is a 0% residual stenosis.   Post intervention, there is a 0% residual stenosis.   There is severe left ventricular systolic dysfunction.   Acute anterior wall myocardial infarction secondary to total LAD occlusion at the takeoff of the first diagonal vessel complicated by VF cardiac arrest in the emergency room with successful CPR, intubation,  and ROSC.   Normal large left circumflex coronary artery and mild luminal irregularity of the dominant RCA.   Severe acute LV dysfunction with EF estimated approximately 25% with severe hypokinesis to akinesis/dyskinesis of the anterolateral wall extending to the apex.  LVEDP 17 mmHg.   Successful PCI to the totally occluded LAD with ultimate insertion of a 3.5 x 38 mm Resolute frontier stent postdilated to stent taper from 3.8 proximally to 3.6 at the distal aspect of the stent.  There was mild thrombus following initial stent pression at the ostium of the diagonal vessel which improved and had TIMI-3 flow.   RECOMMENDATION: The patient will be admitted to to heart ICU.  Once nG-tube is placed he will be given oral Brilinta with ultimate discontinuance of cangrelor.  Continue DAPT for minimum of 12 months.  Will reinitiate heparin 10 hours post sheath removal in light of significant wall motion abnormality and thrombus at the ostium of the diagonal.  Plan 2D echo Doppler study in a.m.   Once extubated, initiate post MI ARB therapy as BP allows with possible need to transition to [facility] if LV function does not improve.  Pulmonary critical care will manage the patient's sedation and ventilation.  Initiate aggressive lipid-lowering therapy.  Smoking cessation is essential.  Diagnostic Dominance: Right  Intervention    Assessment & Plan   1.  Coronary artery disease-no chest pain today.  Underwent cardiac catheterization with stenting to his LAD 7/22. Continue atorvastatin, metoprolol, nitroglycerin Continue aspirin May stop Brilinta Heart healthy low-sodium diet-salty 6 given Increase physical activity as tolerated  DOE-reports increased DOE since MI.  Has not yet started CPAP because he has not received his device. Order echocardiogram Increase physical activity as tolerated Continue weight loss  Lower extremity edema-reports intermittent periods of lower extremity swelling.  Reviewed the importance of low-sodium diet.  He and his wife expressed understanding. Salty 6 diet sheet Furosemide-May take 20 mg of furosemide as needed for lower extremity swelling Continue lower extremity support stockings  Hyperlipidemia-LDL 87 on 12/02/21 Continue atorvastatin, ezetimibe Heart healthy low-sodium diet-salty 6 given Increase physical activity as tolerated Refer to lipid clinic  Essential hypertension-BP today 120/86 Continue metoprolol Heart healthy low-sodium diet-salty 6 given Increase physical activity as tolerated  OSA-has not yet received his CPAP. We will reach out to Dr. Pierre Bali for update.  Tobacco abuse-reports that he continues to try to cut back on smoking.  Smoking cessation strongly recommended. Smoking cessation information given  Disposition: Follow-up with Dr. Tresa Endo in 9-12 months.   Thomasene Ripple. Donne Baley NP-C     06/27/2022, 9:38 AM [facility] [address] Office [phone] Fax [phone]  Notice: This dictation was prepared with Dragon dictation along with smaller phrase technology. Any transcriptional errors that result from this process are unintentional and may not be corrected upon review.  I spent 14 minutes examining this patient, reviewing medications, and using patient centered shared decision making involving her cardiac care.  Prior to her visit I spent greater than 20 minutes reviewing her past medical history,  medications, and prior cardiac tests.

## 2022-06-27 ENCOUNTER — Encounter: Payer: Self-pay | Admitting: General Practice

## 2022-06-27 ENCOUNTER — Ambulatory Visit (INDEPENDENT_AMBULATORY_CARE_PROVIDER_SITE_OTHER): Payer: Medicaid Other | Admitting: General Practice

## 2022-06-27 VITALS — BP 120/86 | HR 64 | Ht >= 80 in | Wt 365.0 lb

## 2022-06-27 DIAGNOSIS — G4733 Obstructive sleep apnea (adult) (pediatric): Secondary | ICD-10-CM

## 2022-06-27 DIAGNOSIS — E785 Hyperlipidemia, unspecified: Secondary | ICD-10-CM | POA: Diagnosis not present

## 2022-06-27 DIAGNOSIS — I10 Essential (primary) hypertension: Secondary | ICD-10-CM | POA: Diagnosis not present

## 2022-06-27 DIAGNOSIS — M7989 Other specified soft tissue disorders: Secondary | ICD-10-CM

## 2022-06-27 DIAGNOSIS — I2510 Atherosclerotic heart disease of native coronary artery without angina pectoris: Secondary | ICD-10-CM | POA: Diagnosis not present

## 2022-06-27 DIAGNOSIS — F17200 Nicotine dependence, unspecified, uncomplicated: Secondary | ICD-10-CM

## 2022-06-27 LAB — BASIC METABOLIC PANEL
BUN/Creatinine Ratio: 9 (ref 9–20)
BUN: 9 mg/dL (ref 6–24)
CO2: 21 mmol/L (ref 20–29)
Calcium: 9.1 mg/dL (ref 8.7–10.2)
Chloride: 101 mmol/L (ref 96–106)
Creatinine, Ser: 1.03 mg/dL (ref 0.76–1.27)
Glucose: 87 mg/dL (ref 70–99)
Potassium: 4.4 mmol/L (ref 3.5–5.2)
Sodium: 138 mmol/L (ref 134–144)
eGFR: 90 mL/min/{1.73_m2} (ref >59)

## 2022-06-27 MED ORDER — FUROSEMIDE 20 MG PO TABS: 20.0000 mg | ORAL_TABLET | Freq: Every day | ORAL | 3 refills | Status: AC | PRN

## 2022-06-27 NOTE — Patient Instructions (Signed)
Medication Instructions:  STOP BRILINTA  TAKE FUROSEMIDE 20MG  AS NEEDED FOR SWELLING  *If you need a refill on your cardiac medications before your next appointment, please call your pharmacy*  Lab Work:    BMET TODAY    If you have labs (blood work) drawn today and your tests are completely normal, you will receive your results only by: MyChart Message (if you have MyChart) OR  A paper copy in the mail If you have any lab test that is abnormal or we need to change your treatment, we will call you to review the results.  Testing/Procedures:  Echocardiogram - Your physician has requested that you have an echocardiogram. Echocardiography is a painless test that uses sound waves to create images of your heart. It provides your doctor with information about the size and shape of your heart and how well your heart's chambers and valves are working. This procedure takes approximately one hour. There are no restrictions for this procedure.   Special Instructions PLEASE READ AND FOLLOW SALTY 6-ATTACHED-1,800mg  daily  PLEASE INCREASE PHYSICAL ACTIVITY AS TOLERATED-150 MIN MODERATED PHYSICAL ACTIVITY WEEKLY   PLEASE READ CESSATION TIPS  PLEASE TAKE FUROSEMIDE AS NEEDED-CALL IF YOU USE MORE THAN TWICE WEEKY   Follow-Up: Your next appointment:  1 month(s) In Person with , MD   Please call our office 2 months in advance to schedule this appointment  :1  At [facility], you and your health needs are our priority.  As part of our continuing mission to provide you with exceptional heart care, we have created designated Provider Care Teams.  These Care Teams include your primary Cardiologist (physician) and Advanced Practice Providers (APPs -  Physician Assistants and Nurse Practitioners) who all work together to provide you with the care you need, when you need it. Important Information About Sugar             6 SALTY THINGS TO AVOID     1,800MG  DAILY      Steps to Quit  Smoking Smoking tobacco is the leading cause of preventable death. It can affect almost every organ in the body. Smoking puts you and people around you at risk for many serious, long-lasting (chronic) diseases. Quitting smoking can be hard, but it is one of the best things that you can do for your health. It is never too late to quit. Do not give up if you cannot quit the first time. Some people need to try many times to quit. Do your best to stick to your quit plan, and talk with your doctor if you have any questions or concerns. How do I get ready to quit? Pick a date to quit. Set a date within the next 2 weeks to give you time to prepare. Write down the reasons why you are quitting. Keep this list in places where you will see it often. Tell your family, friends, and co-workers that you are quitting. Their support is important. Talk with your doctor about the choices that may help you quit. Find out if your health insurance will pay for these treatments. Know the people, places, things, and activities that make you want to smoke (triggers). Avoid them. What first steps can I take to quit smoking? Throw away all cigarettes at home, at work, and in your car. Throw away the things that you use when you smoke, such as ashtrays and lighters. Clean your car. Empty the ashtray. Clean your home, including curtains and carpets. What can I do to  help me quit smoking? Talk with your doctor about taking medicines and seeing a counselor. You are more likely to succeed when you do both. If you are pregnant or breastfeeding: Talk with your doctor about counseling or other ways to quit smoking. Do not take medicine to help you quit smoking unless your doctor tells you to. Quit right away Quit smoking completely, instead of slowly cutting back on how much you smoke over a period of time. Stopping smoking right away may be more successful than slowly quitting. Go to counseling. In-person is best if this is an  option. You are more likely to quit if you go to counseling sessions regularly. Take medicine You may take medicines to help you quit. Some medicines need a prescription, and some you can buy over-the-counter. Some medicines may contain a drug called nicotine to replace the nicotine in cigarettes. Medicines may: Help you stop having the desire to smoke (cravings). Help to stop the problems that come when you stop smoking (withdrawal symptoms). Your doctor may ask you to use: Nicotine patches, gum, or lozenges. Nicotine inhalers or sprays. Non-nicotine medicine that you take by mouth. Find resources Find resources and other ways to help you quit smoking and remain smoke-free after you quit. They include: Online chats with a Veterinary surgeon. Phone quitlines. Printed Materials engineer. Support groups or group counseling. Text messaging programs. Mobile phone apps. Use apps on your mobile phone or tablet that can help you stick to your quit plan. Examples of free services include Quit Guide from the CDC and smokefree.gov  What can I do to make it easier to quit?  Talk to your family and friends. Ask them to support and encourage you. Call a phone quitline, such as 1-800-QUIT-NOW, reach out to support groups, or work with a Veterinary surgeon. Ask people who smoke to not smoke around you. Avoid places that make you want to smoke, such as: Bars. Parties. Smoke-break areas at work. Spend time with people who do not smoke. Lower the stress in your life. Stress can make you want to smoke. Try these things to lower stress: Getting regular exercise. Doing deep-breathing exercises. Doing yoga. Meditating. What benefits will I see if I quit smoking? Over time, you may have: A better sense of smell and taste. Less coughing and sore throat. A slower heart rate. Lower blood pressure. Clearer skin. Better breathing. Fewer sick days. Summary Quitting smoking can be hard, but it is one of the best things  that you can do for your health. Do not give up if you cannot quit the first time. Some people need to try many times to quit. When you decide to quit smoking, make a plan to help you succeed. Quit smoking right away, not slowly over a period of time. When you start quitting, get help and support to keep you smoke-free. This information is not intended to replace advice given to you by your health care provider. Make sure you discuss any questions you have with your health care provider. Document Revised: 10/21/2021 Document Reviewed: 10/21/2021 Elsevier Patient Education  2023 [employer].

## 2022-07-10 ENCOUNTER — Ambulatory Visit (HOSPITAL_COMMUNITY): Payer: Medicaid Other | Attending: Cardiology

## 2022-07-10 DIAGNOSIS — E785 Hyperlipidemia, unspecified: Secondary | ICD-10-CM | POA: Diagnosis not present

## 2022-07-10 DIAGNOSIS — I10 Essential (primary) hypertension: Secondary | ICD-10-CM | POA: Diagnosis not present

## 2022-07-10 DIAGNOSIS — I2510 Atherosclerotic heart disease of native coronary artery without angina pectoris: Secondary | ICD-10-CM | POA: Diagnosis present

## 2022-07-10 LAB — ECHOCARDIOGRAM COMPLETE
Area-P 1/2: 2.95 cm2
S' Lateral: 3.7 cm

## 2022-07-11 ENCOUNTER — Other Ambulatory Visit: Payer: Self-pay

## 2022-07-11 DIAGNOSIS — Z720 Tobacco use: Secondary | ICD-10-CM

## 2022-07-11 DIAGNOSIS — G4733 Obstructive sleep apnea (adult) (pediatric): Secondary | ICD-10-CM

## 2022-07-11 DIAGNOSIS — R0609 Other forms of dyspnea: Secondary | ICD-10-CM

## 2022-07-23 NOTE — Progress Notes (Unsigned)
Cardiology Clinic Note   Patient Name: Gary Sandoval Date of Encounter: 07/25/2022  Primary Care Provider:  Lonie Peak, PA-C Primary Cardiologist:  Nicki Guadalajara, MD  Patient Profile    Gary Sandoval 46 year old male presents the clinic today for follow-up evaluation of his coronary artery disease and essential hypertension.  Past Medical History    Past Medical History:  Diagnosis Date   A-fib [facility])    Atrial fibrillation [facility])    Past Surgical History:  Procedure Laterality Date   CHOLECYSTECTOMY     CORONARY/GRAFT ACUTE MI REVASCULARIZATION N/A 06/10/2021   Procedure: Coronary/Graft Acute MI Revascularization;  Surgeon: Lennette Bihari, MD;  Location: [facility] INVASIVE CV LAB;  Service: Cardiovascular;  Laterality: N/A;   LEFT HEART CATH AND CORONARY ANGIOGRAPHY N/A 06/10/2021   Procedure: LEFT HEART CATH AND CORONARY ANGIOGRAPHY;  Surgeon: Lennette Bihari, MD;  Location: [facility] INVASIVE CV LAB;  Service: Cardiovascular;  Laterality: N/A;    Allergies  Allergies  Allergen Reactions   Codeine Other (See Comments)    Makes him act wierd    History of Present Illness    Gary Sandoval has a PMH of atrial fibrillation, essential hypertension, coronary artery disease, hyperlipidemia, hypokalemia, and tobacco abuse.  He underwent cardiac catheterization in the setting of anterior MI which was complicated by V-fib arrest.  He received DES to his LAD.  His echocardiogram 06/12/2021 showed an LVEF of 60 to 65%.  He was seen in follow-up 8/22.  During that time he continued to do well.  He was continued on aspirin, Brilinta, metoprolol, amiodarone, and a atorvastatin.  He denied recurrent chest discomfort.  He had reduced his smoking to 8 cigarettes/day.  He was unaware of palpitations.  It was recommended that he stay out of work for 6/8 weeks and return to light duty after.  He was seen in follow-up on 09/06/2021.  During that time he reported that he had not returned to work  because there was no light duty work for him.  He believes he was having some memory issues.  He had been participating in cardiac rehab.  His lab work 9/22 showed LDL of 68.  His renal function was stable at 1.06.  He denied chest pain.  His blood pressure is well controlled.  He presented to the clinic 06/27/2022 for follow-up evaluation stated he continued with increased fatigue and shortness of breath.  He reported that he tried to increase his physical activity but was limited due to fatigue that he had noticed since his MI.  We reviewed his previous angiography and echocardiogram.  We reviewed his medication.  He indicated that he had been eating a reduced calorie diet but had not been able to lose weight.  He did note occasional episodes of edema both on his arms and his legs.  He requested diuresis.  We reviewed his smoking.  He reported that he continued to try to reduce the amount that he smoked.  He had not yet received his CPAP device.  I  ordered a repeat echocardiogram, BMP, as needed furosemide, gave smoking cessation information, and plan follow-up for 1 month.  His follow-up echocardiogram showed an LVEF of 60-65%, normal diastolic parameters, and no structural valvular abnormalities.  Recommendation for referral to pulmonology was made.  He presents to the clinic today for follow-up evaluation states he is feeling some better.  He received his CPAP device.  He is using it nightly.  He reports that he is  now dreaming and sleeping much better.  He does report some mild headache.  We reviewed his echocardiogram which showed normal LV function and no valvular abnormalities.  He expressed understanding.  He continues to work on cutting back on smoking.  He is now smoking just less than 1 pack/day.  I will have him increase his physical activity as tolerated, continue his heart healthy diet and plan follow-up for 12 months.  Today he denies chest pain, shortness of breath, lower extremity edema,  fatigue, palpitations, melena, hematuria, hemoptysis, diaphoresis, weakness, presyncope, syncope, orthopnea, and PND.    Home Medications    Prior to Admission medications   Medication Sig Start Date End Date Taking? Authorizing Provider  atorvastatin (LIPITOR) 80 MG tablet Take 1 tablet (80 mg total) by mouth daily. 05/26/22   Lennette Bihari, MD  ECHINACEA PO Take 2 capsules by mouth 2 (two) times daily.    [provider]  ezetimibe (ZETIA) 10 MG tablet Take 1 tablet (10 mg total) by mouth daily. 05/26/22 05/21/23  Lennette Bihari, MD  metoprolol succinate (TOPROL-XL) 50 MG 24 hr tablet Take one-half tablet (25 mg) for 2 weeks, then take the whole tablet (50 mg) afterward. 05/26/22   Lennette Bihari, MD  nitroGLYCERIN (NITROSTAT) 0.4 MG SL tablet Place 1 tablet (0.4 mg total) under the tongue every 5 (five) minutes as needed for chest pain. 06/12/21 06/12/22  [facility], Roe Rutherford, PA  OVER THE COUNTER MEDICATION Take 4 tablets by mouth See admin instructions. Juice plus: take 2 vegie tablets and 2 fruit tablets by mouth twice daily    [provider]  pantoprazole (PROTONIX) 40 MG tablet Take 1 tablet (40 mg total) by mouth daily. 05/26/22   Lennette Bihari, MD  ticagrelor (BRILINTA) 90 MG TABS tablet Take 1 tablet (90 mg total) by mouth 2 (two) times daily. 05/26/22   Lennette Bihari, MD    Family History    History reviewed. No pertinent family history. has no family status information on file.   Social History    Social History   Socioeconomic History   Marital status: Married    Spouse name: Not on file   Number of children: Not on file   Years of education: Not on file   Highest education level: Not on file  Occupational History   Not on file  Tobacco Use   Smoking status: Every Day    Packs/day: 1.00    Types: Cigarettes   Smokeless tobacco: Never  Vaping Use   Vaping Use: Never used  Substance and Sexual Activity   Alcohol use: No   Drug use: No   Sexual  activity: Not on file  Other Topics Concern   Not on file  Social History Narrative   Not on file   Social Determinants of Health   Financial Resource Strain: Not on file  Food Insecurity: Not on file  Transportation Needs: Not on file  Physical Activity: Not on file  Stress: Not on file  Social Connections: Not on file  Intimate Partner Violence: Not on file     Review of Systems    General:  No chills, fever, night sweats or weight changes.  Cardiovascular:  No chest pain, dyspnea on exertion, edema, orthopnea, palpitations, paroxysmal nocturnal dyspnea. Dermatological: No rash, lesions/masses Respiratory: No cough, dyspnea Urologic: No hematuria, dysuria Abdominal:   No nausea, vomiting, diarrhea, bright red blood per rectum, melena, or hematemesis Neurologic:  No visual changes, wkns, changes  in mental status. All other systems reviewed and are otherwise negative except as noted above.  Physical Exam    VS:  BP 118/68 (BP Location: Right Arm, Patient Position: Sitting, Cuff Size: Large)   Pulse 60   Ht 6\' 8"  (2.032 m)   Wt (!) 369 lb 12.8 oz (167.7 kg)   BMI 40.62 kg/m  , BMI Body mass index is 40.62 kg/m. GEN: Well nourished, well developed, in no acute distress. HEENT: normal. Neck: Supple, no JVD, carotid bruits, or masses. Cardiac: RRR, no murmurs, rubs, or gallops. No clubbing, cyanosis, generalized bilateral lower extremity nonpitting edema.  Radials/DP/PT 2+ and equal bilaterally.  Respiratory:  Respirations regular and unlabored, clear to auscultation bilaterally. GI: Soft, nontender, nondistended, BS + x 4. MS: no deformity or atrophy. Skin: warm and dry, no rash. Neuro:  Strength and sensation are intact. Psych: Normal affect.  Accessory Clinical Findings    Recent Labs: 12/02/2021: ALT 14; TSH 1.010 06/20/2022: Hemoglobin 15.7; Platelets 205 06/27/2022: BUN 9; Creatinine, Ser 1.03; Potassium 4.4; Sodium 138   Recent Lipid Panel    Component Value  Date/Time   CHOL 135 12/02/2021 0821   TRIG 91 12/02/2021 0821   HDL 31 (L) 12/02/2021 0821   CHOLHDL 4.4 12/02/2021 0821   CHOLHDL 7.4 06/12/2021 0946   VLDL 22 06/12/2021 0946   LDLCALC 87 12/02/2021 0821    ECG personally reviewed by me today-none today.  EKG 06/27/2022 normal sinus rhythm with sinus arrhythmia 64 bpm no acute changes.    Echocardiogram 06/11/2021  IMPRESSIONS     1. Left ventricular ejection fraction, by estimation, is 60 to 65%. The  left ventricle has normal function. The left ventricle has no regional  wall motion abnormalities. There is mild concentric left ventricular  hypertrophy. Left ventricular diastolic  parameters were normal.   2. Right ventricular systolic function is normal. The right ventricular  size is normal. There is normal pulmonary artery systolic pressure.   3. The mitral valve is normal in structure. Trivial mitral valve  regurgitation. No evidence of mitral stenosis.   4. The aortic valve is tricuspid. Aortic valve regurgitation is not  visualized. No aortic stenosis is present.   5. The inferior vena cava is normal in size with greater than 50%  respiratory variability, suggesting right atrial pressure of 3 mmHg.  Cardiac catheterization 06/11/2019    Mid LAD lesion is 80% stenosed.   Prox LAD to Mid LAD lesion is 100% stenosed.   Dist RCA lesion is 20% stenosed.   Mid RCA lesion is 20% stenosed.   A stent was successfully placed.   Post intervention, there is a 0% residual stenosis.   Post intervention, there is a 0% residual stenosis.   There is severe left ventricular systolic dysfunction.   Acute anterior wall myocardial infarction secondary to total LAD occlusion at the takeoff of the first diagonal vessel complicated by VF cardiac arrest in the emergency room with successful CPR, intubation,  and ROSC.   Normal large left circumflex coronary artery and mild luminal irregularity of the dominant RCA.   Severe acute LV  dysfunction with EF estimated approximately 25% with severe hypokinesis to akinesis/dyskinesis of the anterolateral wall extending to the apex.  LVEDP 17 mmHg.   Successful PCI to the totally occluded LAD with ultimate insertion of a 3.5 x 38 mm Resolute frontier stent postdilated to stent taper from 3.8 proximally to 3.6 at the distal aspect of the stent.  There was mild thrombus  following initial stent pression at the ostium of the diagonal vessel which improved and had TIMI-3 flow.   RECOMMENDATION: The patient will be admitted to to heart ICU.  Once nG-tube is placed he will be given oral Brilinta with ultimate discontinuance of cangrelor.  Continue DAPT for minimum of 12 months.  Will reinitiate heparin 10 hours post sheath removal in light of significant wall motion abnormality and thrombus at the ostium of the diagonal.  Plan 2D echo Doppler study in a.m.  Once extubated, initiate post MI ARB therapy as BP allows with possible need to transition to [facility] if LV function does not improve.  Pulmonary critical care will manage the patient's sedation and ventilation.  Initiate aggressive lipid-lowering therapy.  Smoking cessation is essential.  Diagnostic Dominance: Right  Intervention    Echocardiogram 07/10/2022 IMPRESSIONS     1. Left ventricular ejection fraction, by estimation, is 60 to 65%. Left  ventricular ejection fraction by 3D volume is 64 %. The left ventricle has  normal function. The left ventricle has no regional wall motion  abnormalities. Left ventricular diastolic   parameters were normal. The average left ventricular global longitudinal  strain is -24.3 %.   2. Right ventricular systolic function is normal. The right ventricular  size is normal. Tricuspid regurgitation signal is inadequate for assessing  PA pressure.   3. The mitral valve is normal in structure. No evidence of mitral valve  regurgitation. No evidence of mitral stenosis.   4. The aortic valve is  tricuspid. Aortic valve regurgitation is not  visualized. No aortic stenosis is present.   5. The inferior vena cava is dilated in size with >50% respiratory  variability, suggesting right atrial pressure of 8 mmHg.   Comparison(s): 06/11/21 EF 60-65%.   Assessment & Plan   1. DOE-continues to report increased DOE since MI.  Wearing CPAP device and reports much better sleep.  Reassuring echocardiogram. Increase physical activity as tolerated Follow-up with pulmonology  Coronary artery disease-no chest pain today.  Echocardiogram showed normal pumping function and diastolic function.  No valvular abnormalities noted.  Underwent cardiac catheterization with stenting to his LAD 7/22. Continue atorvastatin, metoprolol, nitroglycerin, aspirin Heart healthy low-sodium diet-salty 6 reviewed Increase physical activity as tolerated  Lower extremity edema-euvolemic today.  Reports intermittent periods of lower extremity swelling.  Reviewed the importance of low-sodium diet.  He and his wife expressed understanding. Has not needed PRN lasix. Salty 6 diet sheet-reviewed.   Continue furosemide as needed.   Continue lower extremity support stockings  Essential hypertension-BP today 118/68 Continue metoprolol Heart healthy low-sodium diet-salty 6 reviewed Increase physical activity as tolerated  Hyperlipidemia-LDL 87 on 12/02/21 Continue atorvastatin, ezetimibe Heart healthy low-sodium high-fiber diet Increase physical activity as tolerated Refer to lipid clinic  OSA-has received his CPAP. Sleeping well.  [facility] Follow[facility] with Dr. Tresa Endo  Tobacco abuse-continues to smoke just less than 1 pack/day.  Prior to MI he reports that he was smoking around 3 packs/day. Smoking cessation strongly recommended Smoking cessation information packet previously given  Disposition: Follow-up with Dr. Tresa Endo in 9-12 months.  Thomasene Ripple. Malijah Lietz NP-C     07/25/2022, 8:26 AM [facility] [address] Office [phone] Fax [phone]  Notice: This dictation was prepared with Dragon dictation along with smaller phrase technology. Any transcriptional errors that result from this process are unintentional and may not be corrected upon review.  I spent 14 minutes examining this patient, reviewing medications, and  using patient centered shared decision making involving her cardiac care.  Prior to her visit I spent greater than 20 minutes reviewing her past medical history,  medications, and prior cardiac tests.

## 2022-07-25 ENCOUNTER — Ambulatory Visit: Payer: Medicaid Other | Attending: General Practice | Admitting: General Practice

## 2022-07-25 ENCOUNTER — Encounter: Payer: Self-pay | Admitting: General Practice

## 2022-07-25 VITALS — BP 118/68 | HR 60 | Ht >= 80 in | Wt 369.8 lb

## 2022-07-25 DIAGNOSIS — G4733 Obstructive sleep apnea (adult) (pediatric): Secondary | ICD-10-CM | POA: Diagnosis present

## 2022-07-25 DIAGNOSIS — R0609 Other forms of dyspnea: Secondary | ICD-10-CM | POA: Diagnosis present

## 2022-07-25 DIAGNOSIS — E785 Hyperlipidemia, unspecified: Secondary | ICD-10-CM | POA: Diagnosis present

## 2022-07-25 DIAGNOSIS — M7989 Other specified soft tissue disorders: Secondary | ICD-10-CM | POA: Insufficient documentation

## 2022-07-25 DIAGNOSIS — I2510 Atherosclerotic heart disease of native coronary artery without angina pectoris: Secondary | ICD-10-CM | POA: Diagnosis present

## 2022-07-25 DIAGNOSIS — I10 Essential (primary) hypertension: Secondary | ICD-10-CM | POA: Diagnosis present

## 2022-07-25 DIAGNOSIS — F17200 Nicotine dependence, unspecified, uncomplicated: Secondary | ICD-10-CM | POA: Diagnosis present

## 2022-07-25 NOTE — Patient Instructions (Signed)
Medication Instructions:   Nio changes   *If you need a refill on your cardiac medications before your next appointment, please call your pharmacy*   Lab Work: Not needed   Testing/Procedures: Not needed   Follow-Up: At [facility], you and your health needs are our priority.  As part of our continuing mission to provide you with exceptional heart care, we have created designated Provider Care Teams.  These Care Teams include your primary Cardiologist (physician) and Advanced Practice Providers (APPs -  Physician Assistants and Nurse Practitioners) who all work together to provide you with the care you need, when you need it.    Your next appointment:   12 month(s)  The format for your next appointment:   In Person  Provider:   Nicki Guadalajara, MD     Other Instructions Your physician discussed the hazards of tobacco use. Tobacco use cessation is recommended and techniques and options to help you quit were discussed.  Your physician discussed the importance of regular exercise and recommended that you start or continue a regular exercise program for good health.   Important Information About Sugar

## 2022-08-14 ENCOUNTER — Institutional Professional Consult (permissible substitution): Payer: Medicaid Other | Admitting: Pulmonary Disease

## 2022-09-23 IMAGING — DX DG CHEST 1V PORT
1 series · 2 of 2 positions shown · non-contrast
Comparison: 05/13/2010

CLINICAL DATA: Chest pain and shortness of breath

EXAM:
PORTABLE CHEST 1 VIEW

[Series 1: chest · 0.14mm/px · 2 of 2 slices shown]
[im 1/2]
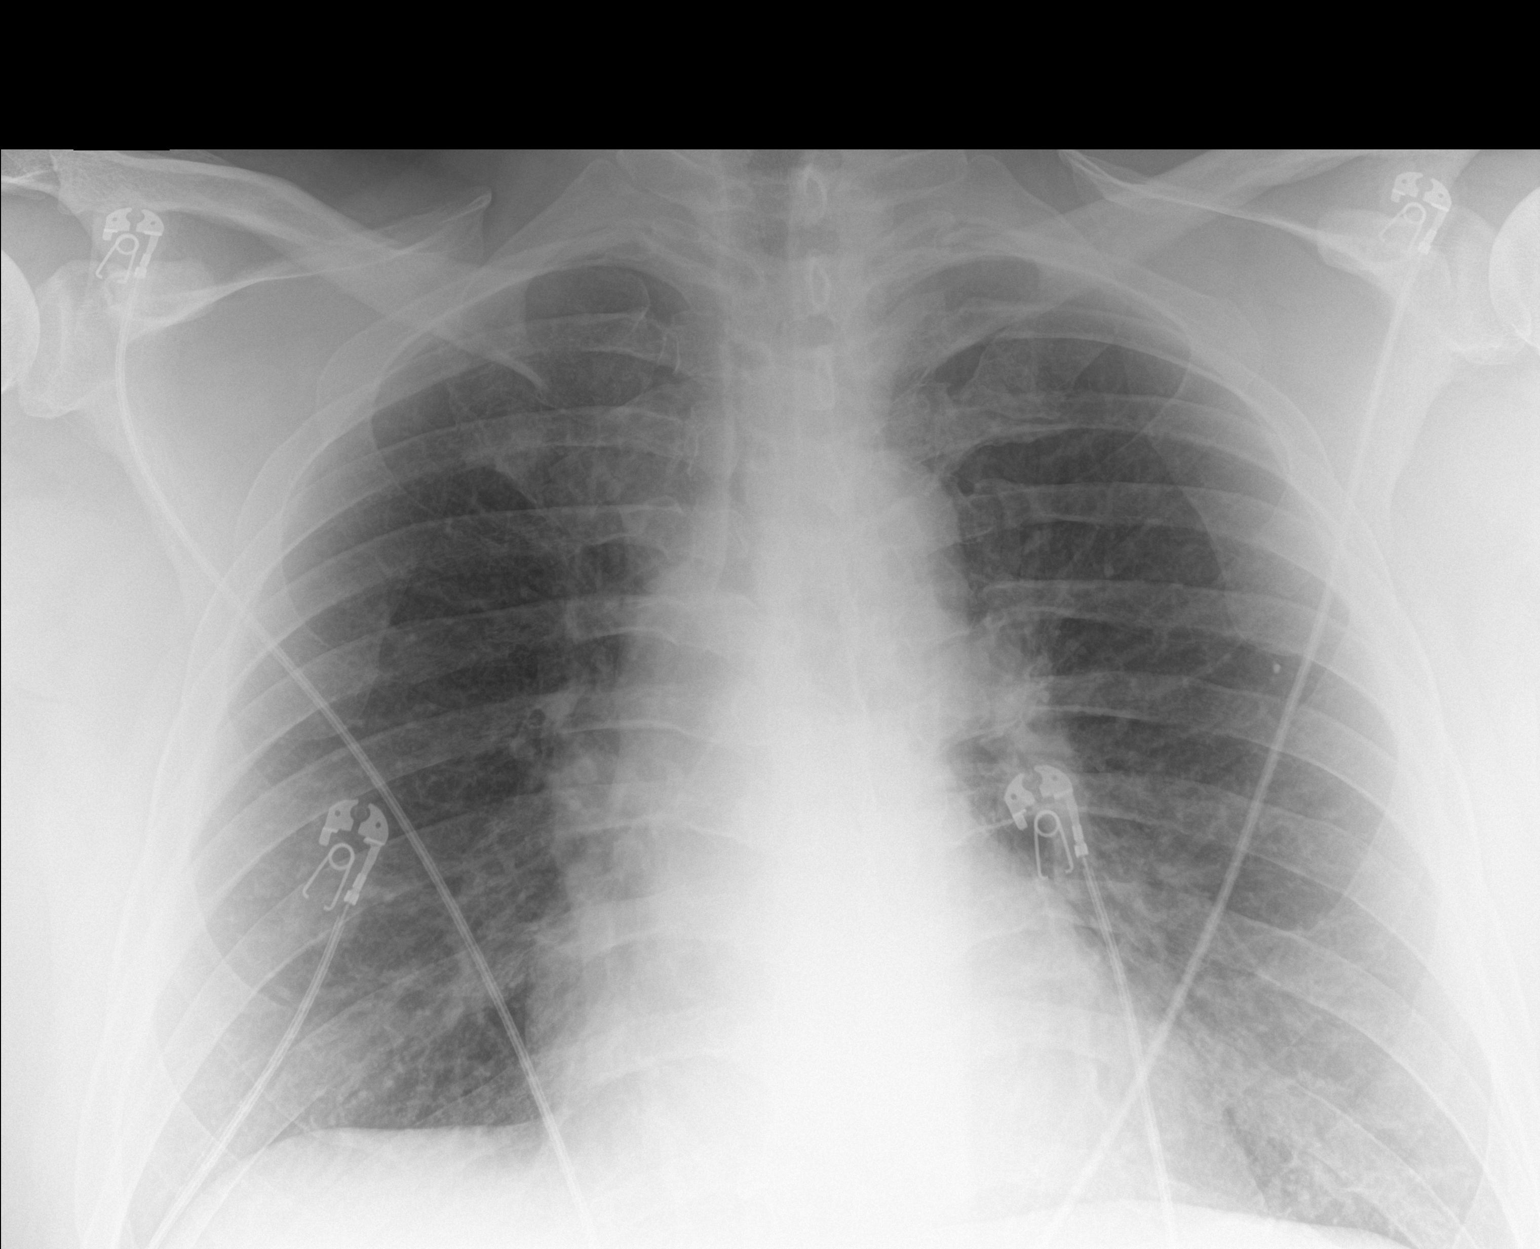
[im 2/2]
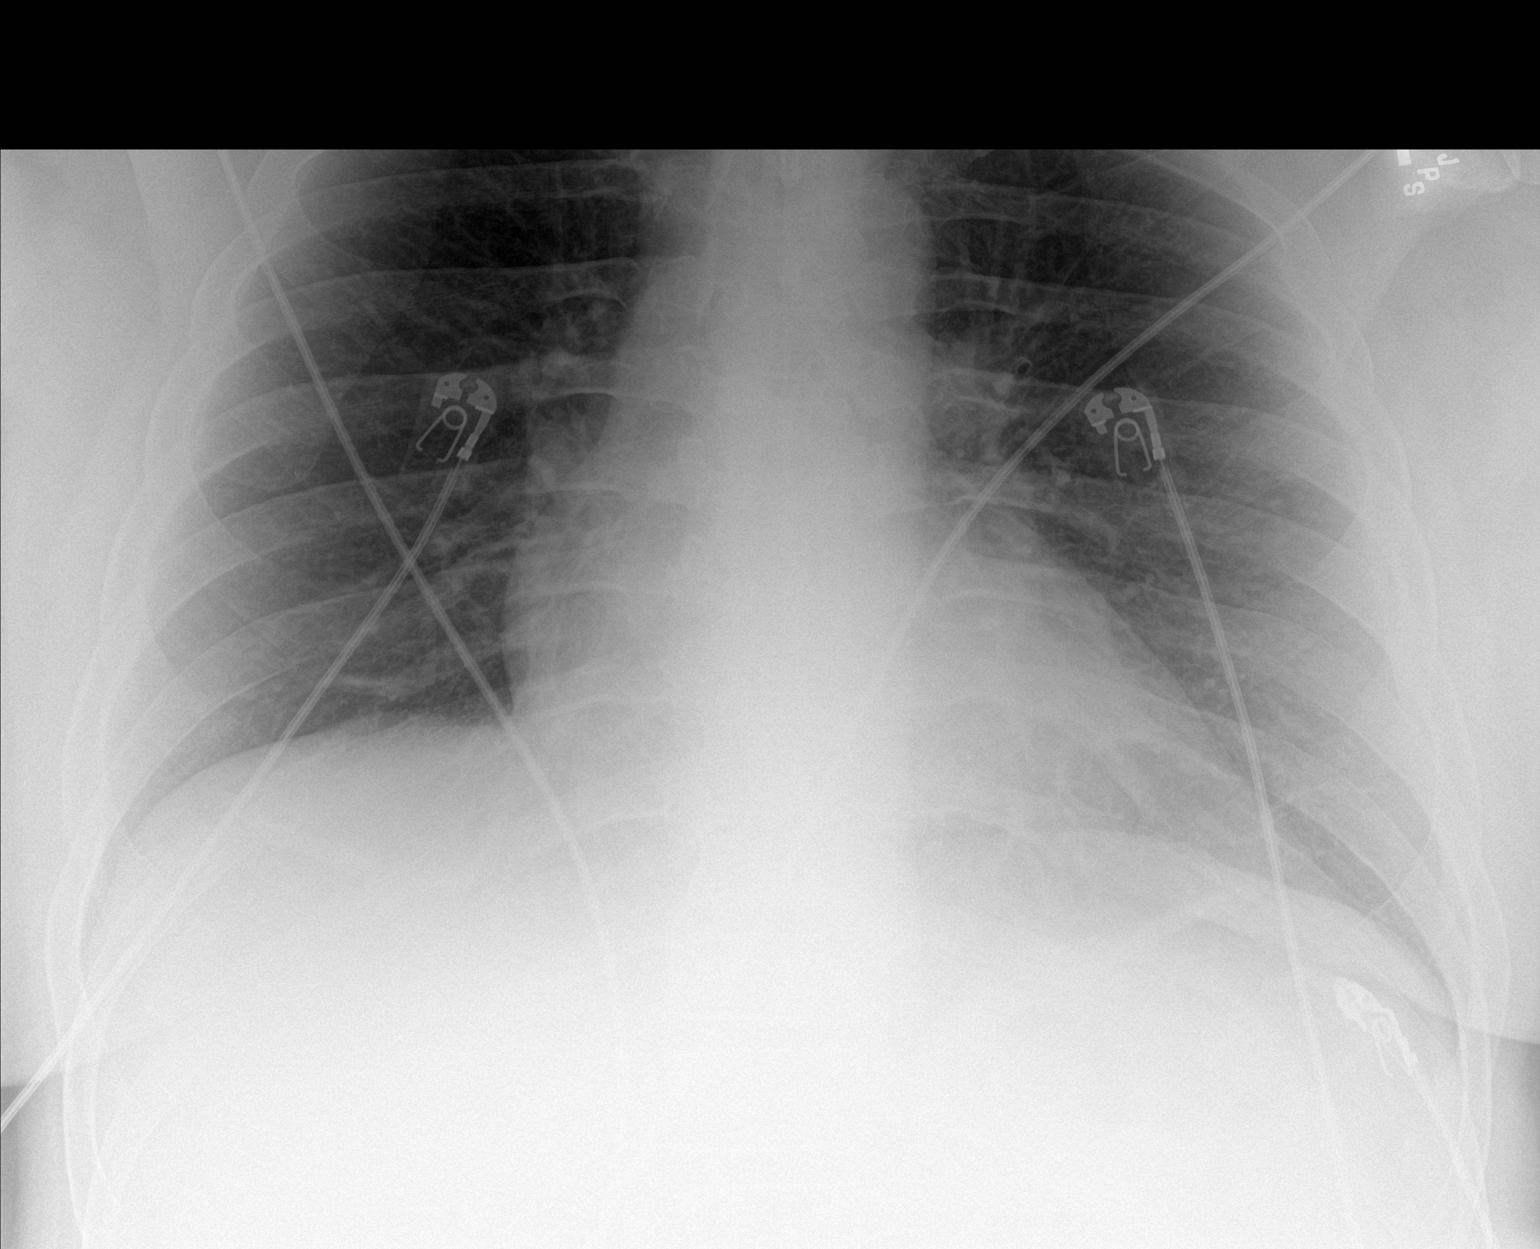

[2 of 2 positions shown; findings below may reference images not displayed]

FINDINGS: Two AP radiographs of the chest were obtained. There are increased
interstitial markings within the semi upright view which do not
persist on the upright view. Minimal linear bibasilar markings
persist, likely atelectasis. Heart size is upper limits of normal,
unchanged. No pleural effusion or pneumothorax.
IMPRESSION: Minimal linear bibasilar atelectasis.  Lungs are otherwise clear.

## 2022-09-23 IMAGING — DX DG CHEST 1V PORT
1 series · 2 of 2 positions shown · non-contrast
Comparison: 06/10/2021

CLINICAL DATA: Chest pain, shortness of breath

EXAM:
PORTABLE CHEST 1 VIEW

[Series 1: chest · 0.14mm/px · 2 of 2 slices shown]
[im 1/2]
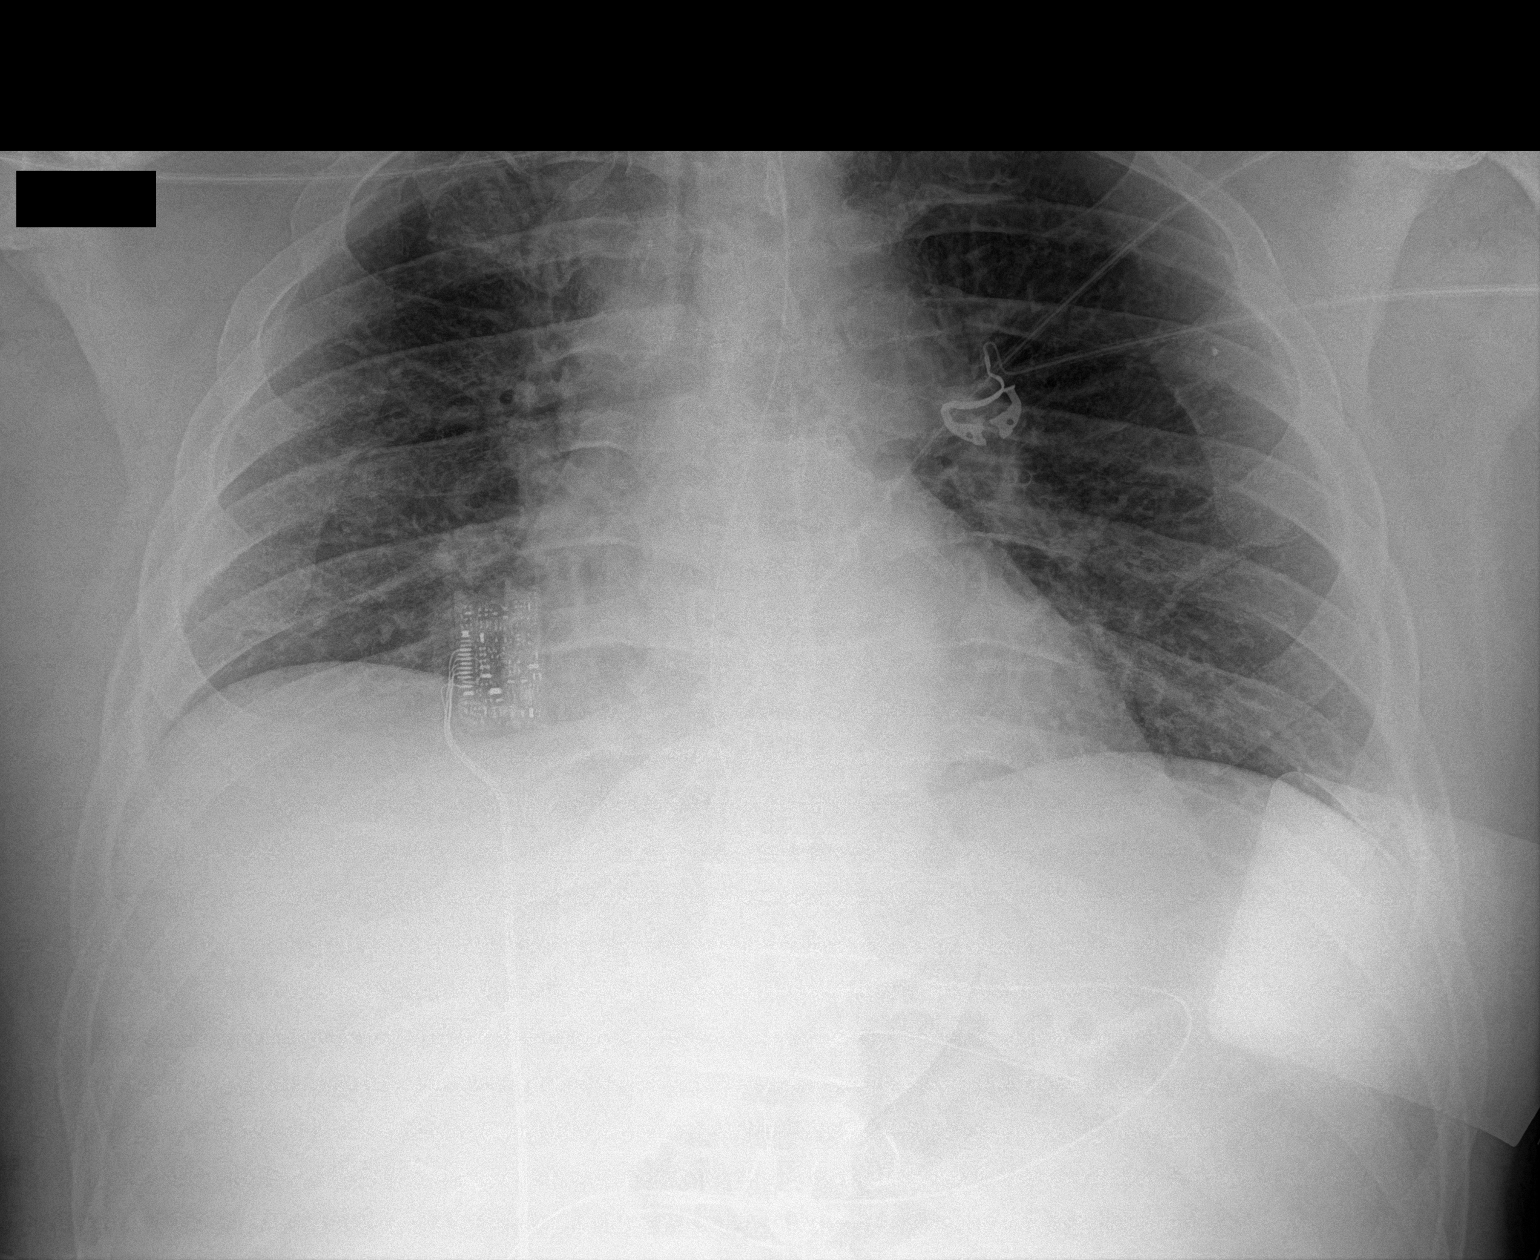
[im 2/2]
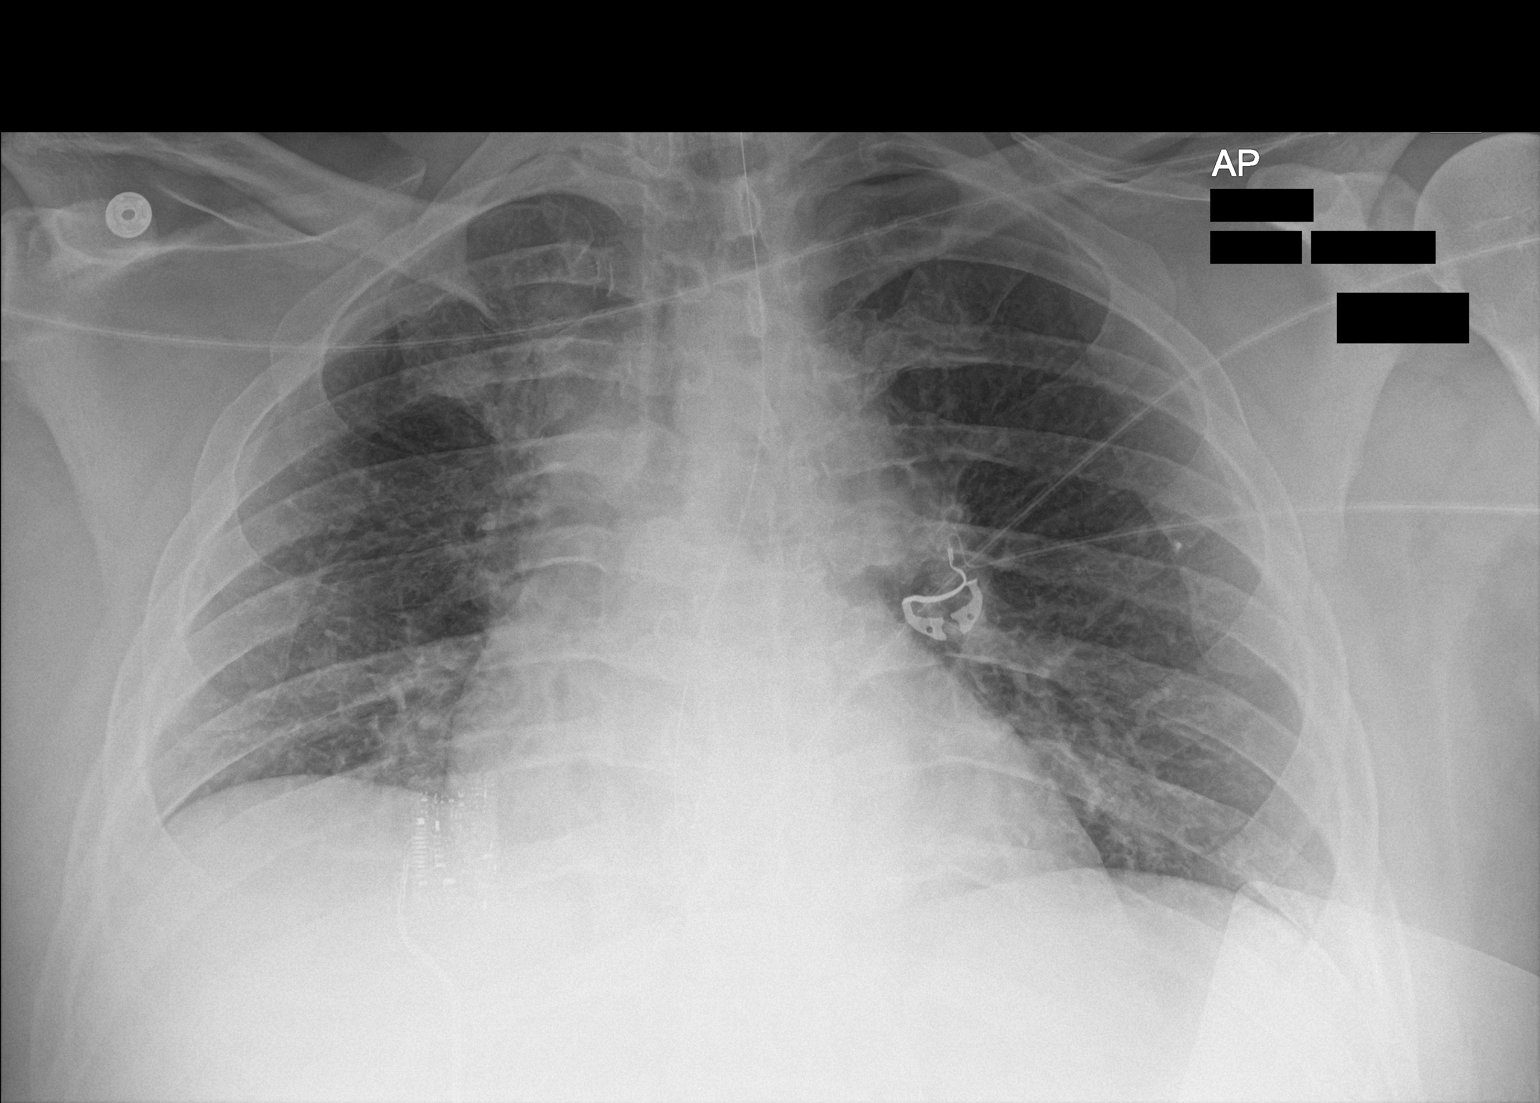

[2 of 2 positions shown; findings below may reference images not displayed]

FINDINGS: NG tube coils in the stomach. Heart and mediastinal contours are
within normal limits. No focal opacities or effusions. No acute bony
abnormality.
IMPRESSION: No active disease.

## 2023-06-08 ENCOUNTER — Encounter: Payer: Self-pay | Admitting: Cardiovascular Disease

## 2023-06-08 DIAGNOSIS — I10 Essential (primary) hypertension: Secondary | ICD-10-CM

## 2023-06-11 MED ORDER — PANTOPRAZOLE SODIUM 40 MG PO TBEC: 40.0000 mg | DELAYED_RELEASE_TABLET | Freq: Every day | ORAL | 0 refills | Status: AC

## 2023-06-11 MED ORDER — EZETIMIBE 10 MG PO TABS: 10.0000 mg | ORAL_TABLET | Freq: Every day | ORAL | 0 refills | Status: DC

## 2023-06-11 MED ORDER — EZETIMIBE 10 MG PO TABS: 10.0000 mg | ORAL_TABLET | Freq: Every day | ORAL | 0 refills | Status: AC

## 2023-06-11 MED ORDER — METOPROLOL SUCCINATE ER 50 MG PO TB24
ORAL_TABLET | ORAL | 0 refills | Status: DC
Start: 2023-06-11 — End: 2023-06-11

## 2023-06-11 MED ORDER — PANTOPRAZOLE SODIUM 40 MG PO TBEC: 40.0000 mg | DELAYED_RELEASE_TABLET | Freq: Every day | ORAL | 0 refills | Status: DC

## 2023-06-11 MED ORDER — ATORVASTATIN CALCIUM 80 MG PO TABS: 80.0000 mg | ORAL_TABLET | Freq: Every day | ORAL | 0 refills | Status: AC

## 2023-06-11 MED ORDER — METOPROLOL SUCCINATE ER 50 MG PO TB24
ORAL_TABLET | ORAL | 0 refills | Status: AC
Start: 2023-06-11 — End: ?

## 2023-06-11 NOTE — Addendum Note (Signed)
Addended by: Hayden Pedro A on: 06/11/2023 09:06 AM   Modules accepted: Orders

## 2023-07-12 ENCOUNTER — Ambulatory Visit: Payer: Medicaid Other | Admitting: General Practice
